# Patient Record
Sex: Female | Born: 1960 | Race: White | Hispanic: No | Smoking: Never smoker
Health system: Southern US, Community
[De-identification: ages and names within clinical notes are randomized; demographics above are authoritative.]

## PROBLEM LIST (undated history)

## (undated) DIAGNOSIS — N159 Renal tubulo-interstitial disease, unspecified: Secondary | ICD-10-CM

## (undated) DIAGNOSIS — I10 Essential (primary) hypertension: Secondary | ICD-10-CM

## (undated) DIAGNOSIS — M2550 Pain in unspecified joint: Secondary | ICD-10-CM

## (undated) DIAGNOSIS — R7303 Prediabetes: Secondary | ICD-10-CM

## (undated) DIAGNOSIS — K219 Gastro-esophageal reflux disease without esophagitis: Secondary | ICD-10-CM

## (undated) DIAGNOSIS — M1990 Unspecified osteoarthritis, unspecified site: Secondary | ICD-10-CM

## (undated) DIAGNOSIS — I872 Venous insufficiency (chronic) (peripheral): Secondary | ICD-10-CM

## (undated) DIAGNOSIS — M549 Dorsalgia, unspecified: Secondary | ICD-10-CM

## (undated) DIAGNOSIS — K5900 Constipation, unspecified: Secondary | ICD-10-CM

## (undated) DIAGNOSIS — E559 Vitamin D deficiency, unspecified: Secondary | ICD-10-CM

## (undated) DIAGNOSIS — G4730 Sleep apnea, unspecified: Secondary | ICD-10-CM

## (undated) HISTORY — DX: Constipation, unspecified: K59.00

## (undated) HISTORY — PX: EYE SURGERY: SHX253

## (undated) HISTORY — DX: Pain in unspecified joint: M25.50

## (undated) HISTORY — PX: ROTATOR CUFF REPAIR: SHX139

## (undated) HISTORY — PX: OTHER SURGICAL HISTORY: SHX169

## (undated) HISTORY — DX: Gastro-esophageal reflux disease without esophagitis: K21.9

## (undated) HISTORY — DX: Vitamin D deficiency, unspecified: E55.9

## (undated) HISTORY — DX: Unspecified osteoarthritis, unspecified site: M19.90

## (undated) HISTORY — DX: Prediabetes: R73.03

## (undated) HISTORY — DX: Dorsalgia, unspecified: M54.9

## (undated) HISTORY — DX: Venous insufficiency (chronic) (peripheral): I87.2

---

## 2006-08-22 ENCOUNTER — Encounter: Admission: RE | Admit: 2006-08-22 | Discharge: 2006-08-22 | Payer: Self-pay | Admitting: Orthopedic Surgery

## 2006-12-25 ENCOUNTER — Other Ambulatory Visit: Admission: RE | Admit: 2006-12-25 | Discharge: 2006-12-25 | Payer: Self-pay | Admitting: *Deleted

## 2008-01-06 ENCOUNTER — Other Ambulatory Visit: Admission: RE | Admit: 2008-01-06 | Discharge: 2008-01-06 | Payer: Self-pay | Admitting: *Deleted

## 2010-08-15 ENCOUNTER — Other Ambulatory Visit: Admission: RE | Admit: 2010-08-15 | Discharge: 2010-08-15 | Payer: Self-pay | Admitting: Obstetrics and Gynecology

## 2010-11-09 ENCOUNTER — Ambulatory Visit (HOSPITAL_COMMUNITY): Admission: RE | Admit: 2010-11-09 | Discharge: 2010-11-09 | Payer: Self-pay | Admitting: Gastroenterology

## 2011-01-21 ENCOUNTER — Encounter: Payer: Self-pay | Admitting: Orthopedic Surgery

## 2011-02-21 ENCOUNTER — Other Ambulatory Visit: Payer: Self-pay | Admitting: Obstetrics and Gynecology

## 2011-04-11 ENCOUNTER — Encounter (HOSPITAL_COMMUNITY)
Admission: RE | Admit: 2011-04-11 | Discharge: 2011-04-11 | Disposition: A | Discharge status: Home / Self Care | Payer: Federal, State, Local not specified - PPO | Source: Ambulatory Visit | Attending: Obstetrics and Gynecology | Admitting: Obstetrics and Gynecology

## 2011-04-11 LAB — CBC
HCT: 37.1 % (ref 36.0–46.0)
MCHC: 31.5 g/dL (ref 30.0–36.0)
MCV: 87.7 fL (ref 78.0–100.0)
RDW: 13.4 % (ref 11.5–15.5)

## 2011-04-12 ENCOUNTER — Other Ambulatory Visit: Payer: Self-pay | Admitting: Obstetrics and Gynecology

## 2011-04-12 ENCOUNTER — Ambulatory Visit (HOSPITAL_COMMUNITY)
Admission: RE | Admit: 2011-04-12 | Discharge: 2011-04-12 | Disposition: A | Discharge status: Home / Self Care | Payer: Federal, State, Local not specified - PPO | Source: Ambulatory Visit | Attending: Obstetrics and Gynecology | Admitting: Obstetrics and Gynecology

## 2011-04-12 DIAGNOSIS — N840 Polyp of corpus uteri: Secondary | ICD-10-CM | POA: Insufficient documentation

## 2011-04-12 DIAGNOSIS — Z01812 Encounter for preprocedural laboratory examination: Secondary | ICD-10-CM | POA: Insufficient documentation

## 2011-04-12 DIAGNOSIS — Z01818 Encounter for other preprocedural examination: Secondary | ICD-10-CM | POA: Insufficient documentation

## 2011-04-12 DIAGNOSIS — N920 Excessive and frequent menstruation with regular cycle: Secondary | ICD-10-CM | POA: Insufficient documentation

## 2011-04-18 NOTE — Op Note (Addendum)
  [name], [name] NO.:  [id number]  MEDICAL RECORD NO.:  [id number]           PATIENT TYPE:  O  LOCATION:  SDC                           FACILITY:  [facility]  PHYSICIAN:  Patsy Baltimore, MD     DATE OF BIRTH:  1961-02-12  DATE OF PROCEDURE: DATE OF DISCHARGE:  04/11/2011                              OPERATIVE REPORT   PREOPERATIVE DIAGNOSES: 1. Uterine polyp. 2. Menorrhagia.  POSTOPERATIVE DIAGNOSES: 1. Uterine polyp. 2. Menorrhagia.  PROCEDURE PERFORMED:  Hysteroscopy, polypectomy, dilation and curettage.  SURGEON:  Patsy Baltimore, MD.  ANESTHESIA:  General.  FINDINGS:  Endometrial polyps.  SPECIMEN:  Endometrial polyps.  DISPOSITION:  Specimen to Pathology.  ESTIMATED BLOOD LOSS:  Minimal.  PROCEDURE IN DETAIL:  [name] reiterated her informed consent on the day of the procedure.  She was consented for a hysteroscopy, polypectomy, dilation and curettage.  She has no known drug allergies. She was taken to the operating room with IV fluids running.  She was put under general anesthesia with ease.  Her legs were lifted up to the dorsal lithotomy position.  She was prepped and draped in the usual sterile fashion.  A time-out was called and we began the procedure.  Her bladder was drained with a red rubber catheter.  A speculum was inserted into her vagina.  Initial inspection showed a uterine polyp coming down towards the cervical os as well as another polyp in the uterus.  The scope was removed and the polyps were then grasped with the polyp forceps and removed.  Sharp curettage was done throughout the endometrial lining removing some more fragments of the polyps. Hysteroscopy was then repeated.  The endometrial cavity appeared clean. Both tubal ostia were visualized.  Some more scraping of the endometrium was done.  A good uterine cry was heard.  All the instruments were then removed from the vagina.  The patient was reawakened and  transferred to the PACU in stable condition.  She tolerated the procedure well.          ______________________________ Patsy Baltimore, MD     CO/MEDQ  D:  04/12/2011  T:  04/12/2011  Job:  [id number]  Electronically Signed by Patsy Baltimore MD on 04/18/2011 01:22:49 PM

## 2012-06-27 ENCOUNTER — Ambulatory Visit
Admission: RE | Admit: 2012-06-27 | Discharge: 2012-06-27 | Disposition: A | Discharge status: Home / Self Care | Payer: Federal, State, Local not specified - PPO | Source: Ambulatory Visit | Attending: Orthopedic Surgery | Admitting: Orthopedic Surgery

## 2012-06-27 ENCOUNTER — Other Ambulatory Visit: Payer: Self-pay | Admitting: Orthopedic Surgery

## 2012-06-27 DIAGNOSIS — M25572 Pain in left ankle and joints of left foot: Secondary | ICD-10-CM

## 2012-10-17 ENCOUNTER — Other Ambulatory Visit: Payer: Self-pay | Admitting: Internal Medicine

## 2012-10-17 DIAGNOSIS — Z1231 Encounter for screening mammogram for malignant neoplasm of breast: Secondary | ICD-10-CM

## 2012-11-25 ENCOUNTER — Ambulatory Visit
Admission: RE | Admit: 2012-11-25 | Discharge: 2012-11-25 | Disposition: A | Discharge status: Home / Self Care | Payer: Federal, State, Local not specified - PPO | Source: Ambulatory Visit | Attending: Internal Medicine | Admitting: Internal Medicine

## 2012-11-25 DIAGNOSIS — Z1231 Encounter for screening mammogram for malignant neoplasm of breast: Secondary | ICD-10-CM

## 2014-07-07 ENCOUNTER — Ambulatory Visit
Admission: RE | Admit: 2014-07-07 | Discharge: 2014-07-07 | Disposition: A | Discharge status: Home / Self Care | Payer: Federal, State, Local not specified - PPO | Source: Ambulatory Visit | Attending: Internal Medicine | Admitting: Internal Medicine

## 2014-07-07 ENCOUNTER — Other Ambulatory Visit: Payer: Self-pay | Admitting: Internal Medicine

## 2014-07-07 DIAGNOSIS — M549 Dorsalgia, unspecified: Secondary | ICD-10-CM

## 2014-09-29 ENCOUNTER — Other Ambulatory Visit: Payer: Self-pay | Admitting: Neurosurgery

## 2014-09-29 DIAGNOSIS — M5416 Radiculopathy, lumbar region: Secondary | ICD-10-CM

## 2014-10-13 ENCOUNTER — Ambulatory Visit
Admission: RE | Admit: 2014-10-13 | Discharge: 2014-10-13 | Disposition: A | Discharge status: Home / Self Care | Payer: Federal, State, Local not specified - PPO | Source: Ambulatory Visit | Attending: Neurosurgery | Admitting: Neurosurgery

## 2014-10-13 DIAGNOSIS — M5416 Radiculopathy, lumbar region: Secondary | ICD-10-CM

## 2014-10-14 ENCOUNTER — Other Ambulatory Visit: Payer: Federal, State, Local not specified - PPO

## 2015-04-18 ENCOUNTER — Other Ambulatory Visit: Payer: Self-pay | Admitting: Orthopedic Surgery

## 2015-04-18 DIAGNOSIS — M25511 Pain in right shoulder: Secondary | ICD-10-CM

## 2015-05-07 ENCOUNTER — Ambulatory Visit
Admission: RE | Admit: 2015-05-07 | Discharge: 2015-05-07 | Disposition: A | Discharge status: Home / Self Care | Payer: Federal, State, Local not specified - PPO | Source: Ambulatory Visit | Attending: Orthopedic Surgery | Admitting: Orthopedic Surgery

## 2015-05-07 DIAGNOSIS — M25511 Pain in right shoulder: Secondary | ICD-10-CM

## 2016-06-29 DIAGNOSIS — G4733 Obstructive sleep apnea (adult) (pediatric): Secondary | ICD-10-CM | POA: Diagnosis not present

## 2016-07-29 DIAGNOSIS — G4733 Obstructive sleep apnea (adult) (pediatric): Secondary | ICD-10-CM | POA: Diagnosis not present

## 2016-08-02 DIAGNOSIS — Z79899 Other long term (current) drug therapy: Secondary | ICD-10-CM | POA: Diagnosis not present

## 2016-08-02 DIAGNOSIS — Z0000 Encounter for general adult medical examination without abnormal findings: Secondary | ICD-10-CM | POA: Diagnosis not present

## 2016-08-02 DIAGNOSIS — K219 Gastro-esophageal reflux disease without esophagitis: Secondary | ICD-10-CM | POA: Diagnosis not present

## 2016-08-02 DIAGNOSIS — Z5181 Encounter for therapeutic drug level monitoring: Secondary | ICD-10-CM | POA: Diagnosis not present

## 2016-08-02 DIAGNOSIS — I10 Essential (primary) hypertension: Secondary | ICD-10-CM | POA: Diagnosis not present

## 2016-08-29 DIAGNOSIS — G4733 Obstructive sleep apnea (adult) (pediatric): Secondary | ICD-10-CM | POA: Diagnosis not present

## 2016-09-29 DIAGNOSIS — G4733 Obstructive sleep apnea (adult) (pediatric): Secondary | ICD-10-CM | POA: Diagnosis not present

## 2016-10-29 DIAGNOSIS — G4733 Obstructive sleep apnea (adult) (pediatric): Secondary | ICD-10-CM | POA: Diagnosis not present

## 2016-11-26 DIAGNOSIS — Z131 Encounter for screening for diabetes mellitus: Secondary | ICD-10-CM | POA: Diagnosis not present

## 2016-11-26 DIAGNOSIS — Z1329 Encounter for screening for other suspected endocrine disorder: Secondary | ICD-10-CM | POA: Diagnosis not present

## 2016-11-26 DIAGNOSIS — E559 Vitamin D deficiency, unspecified: Secondary | ICD-10-CM | POA: Diagnosis not present

## 2016-11-26 DIAGNOSIS — K219 Gastro-esophageal reflux disease without esophagitis: Secondary | ICD-10-CM | POA: Diagnosis not present

## 2016-11-26 DIAGNOSIS — R52 Pain, unspecified: Secondary | ICD-10-CM | POA: Diagnosis not present

## 2016-11-26 DIAGNOSIS — I10 Essential (primary) hypertension: Secondary | ICD-10-CM | POA: Diagnosis not present

## 2016-11-29 DIAGNOSIS — G4733 Obstructive sleep apnea (adult) (pediatric): Secondary | ICD-10-CM | POA: Diagnosis not present

## 2017-02-12 ENCOUNTER — Emergency Department (HOSPITAL_COMMUNITY)
Admission: EM | Admit: 2017-02-12 | Discharge: 2017-02-12 | Disposition: A | Discharge status: Home / Self Care | Payer: Federal, State, Local not specified - PPO | Attending: Emergency Medicine | Admitting: Emergency Medicine

## 2017-02-12 ENCOUNTER — Encounter (HOSPITAL_COMMUNITY): Payer: Self-pay

## 2017-02-12 DIAGNOSIS — I10 Essential (primary) hypertension: Secondary | ICD-10-CM

## 2017-02-12 DIAGNOSIS — R519 Headache, unspecified: Secondary | ICD-10-CM

## 2017-02-12 DIAGNOSIS — R51 Headache: Secondary | ICD-10-CM

## 2017-02-12 DIAGNOSIS — Z87891 Personal history of nicotine dependence: Secondary | ICD-10-CM | POA: Insufficient documentation

## 2017-02-12 DIAGNOSIS — E876 Hypokalemia: Secondary | ICD-10-CM | POA: Diagnosis not present

## 2017-02-12 HISTORY — DX: Essential (primary) hypertension: I10

## 2017-02-12 LAB — BASIC METABOLIC PANEL
ANION GAP: 10 (ref 5–15)
BUN: 14 mg/dL (ref 6–20)
CALCIUM: 9.5 mg/dL (ref 8.9–10.3)
CO2: 29 mmol/L (ref 22–32)
CREATININE: 0.76 mg/dL (ref 0.44–1.00)
Chloride: 100 mmol/L — ABNORMAL LOW (ref 101–111)
GFR calc Af Amer: >60 mL/min (ref >60)
GLUCOSE: 142 mg/dL — AB (ref 65–99)
Potassium: 3.1 mmol/L — ABNORMAL LOW (ref 3.5–5.1)
Sodium: 139 mmol/L (ref 135–145)

## 2017-02-12 MED ORDER — POTASSIUM CHLORIDE CRYS ER 20 MEQ PO TBCR: 20.0000 meq | EXTENDED_RELEASE_TABLET | Freq: Every day | ORAL | 0 refills | Status: DC

## 2017-02-12 MED ORDER — IBUPROFEN 200 MG PO TABS
200.0000 mg | ORAL_TABLET | Freq: Once | ORAL | Status: AC
  Administered 2017-02-12: 200 mg via ORAL
  Filled 2017-02-12: qty 1

## 2017-02-12 MED ORDER — OXYCODONE-ACETAMINOPHEN 5-325 MG PO TABS
1.0000 | ORAL_TABLET | Freq: Once | ORAL | Status: AC
  Administered 2017-02-12: 1 via ORAL
  Filled 2017-02-12: qty 1

## 2017-02-12 MED ORDER — ASPIRIN-ACETAMINOPHEN-CAFFEINE 250-250-65 MG PO TABS
1.0000 | ORAL_TABLET | Freq: Once | ORAL | Status: AC
  Administered 2017-02-12: 1 via ORAL
  Filled 2017-02-12 (×2): qty 1

## 2017-02-12 MED ORDER — POTASSIUM CHLORIDE CRYS ER 20 MEQ PO TBCR
40.0000 meq | EXTENDED_RELEASE_TABLET | Freq: Once | ORAL | Status: AC
  Administered 2017-02-12: 40 meq via ORAL
  Filled 2017-02-12: qty 2

## 2017-02-12 NOTE — ED Triage Notes (Signed)
Pt here with htn and headache.  Pt has had changes in her bp meds recently.  Keeping throbbing headaches.

## 2017-02-12 NOTE — Discharge Instructions (Signed)
It was our pleasure to provide your ER care today - we hope that you feel better.  Rest. Drink adequate fluids.  Continue your blood pressure medication.  Limit salt intake.  See hypertension instructions.   Your blood pressure is high today - follow up with primary care doctor in the next few days.  From todays labs, your potassium level is mildly low (3.1) - eat plenty of fruits and vegetables, take potassium supplement as prescribed, and follow up with primary care doctor in 1 week.   Return to ER if worse, severe headache, chest pain, trouble breathing, other concern.  You were given pain medication in the ER - no driving for the next 4 hours.

## 2017-02-12 NOTE — ED Provider Notes (Signed)
[facility]-EMERGENCY DEPT Provider Note   CSN: [id number] Arrival date & time: 02/12/17  1313     History   Chief Complaint Chief Complaint  Patient presents with  . Headache    HPI Stacy Wilkerson is a 56 y.o. female.  Patient c/o intermittent frontal headache for the past 3 days. Pain is throbbing dull, moderate. Headache was gradual in onset, and mild at onset. No acute or abrupt worsening, or severe head pain. No neck pain or stiffness. Denies eye pain or change in vision. No associated numbness/weakness, or change in normal functional ability. No change in speech, balance or coordination. Denies specific exacerbating or alleviating factors.  No hx migraines or chronic headaches. Pt indicates she feels it is related to her blood pressure which has been high. Indicates 2 months ago was changed from amlodipine to lisinopril, and indicates yesterday had hctz added as bp still high. No chest pain or sob. No swelling.  Pt denies uri symptoms, no sinus drainage or pain.    The history is provided by the patient.  Headache   Pertinent negatives include no fever, no shortness of breath, no nausea and no vomiting.    Past Medical History:  Diagnosis Date  . Hypertension     There are no active problems to display for this patient.   Past Surgical History:  Procedure Laterality Date  . ROTATOR CUFF REPAIR      OB History    No data available       Home Medications    Prior to Admission medications   Not on File    Family History History reviewed. No pertinent family history.  Social History Social History  Substance Use Topics  . Smoking status: Former Games developermoker  . Smokeless tobacco: Never Used  . Alcohol use Yes     Comment: social     Allergies   Sulfa antibiotics   Review of Systems Review of Systems  Constitutional: Negative for fever.  HENT: Negative for sore throat.   Eyes: Negative for pain and visual disturbance.  Respiratory: Negative for shortness  of breath.   Cardiovascular: Negative for chest pain and leg swelling.  Gastrointestinal: Negative for abdominal pain, nausea and vomiting.  Genitourinary: Negative for flank pain.  Musculoskeletal: Negative for back pain and neck pain.  Skin: Negative for rash.  Neurological: Positive for headaches. Negative for weakness and numbness.  Hematological: Does not bruise/bleed easily.  Psychiatric/Behavioral: Negative for confusion.     Physical Exam Updated Vital Signs BP (!) 192/101   Pulse 77   Temp 97.8 F (36.6 C) (Oral)   Resp 16   SpO2 98%   Physical Exam  Constitutional: She is oriented to person, place, and time. She appears well-developed and well-nourished. No distress.  HENT:  Head: Atraumatic.  Nose: Nose normal.  Mouth/Throat: Oropharynx is clear and moist.  No sinus or temporal tenderness.  Eyes: Conjunctivae and EOM are normal. Pupils are equal, round, and reactive to light. No scleral icterus.  Neck: Neck supple. No tracheal deviation present. No thyromegaly present.  No stiffness or rigidity.   Cardiovascular: Normal rate, regular rhythm, normal heart sounds and intact distal pulses.  Exam reveals no gallop and no friction rub.   No murmur heard. Pulmonary/Chest: Effort normal and breath sounds normal. No respiratory distress.  Abdominal: Soft. Normal appearance and bowel sounds are normal. She exhibits no distension. There is no tenderness.  No bruits.   Genitourinary:  Genitourinary Comments: No cva tenderness.  Musculoskeletal:  Normal range of motion. She exhibits no edema or tenderness.  Neurological: She is alert and oriented to person, place, and time. No cranial nerve deficit.  Speech clear/fluent. No pronator drift. Motor intact bilaterally, stre 5/5. sens grossly intact.  Steady gait.   Skin: Skin is warm and dry. No rash noted. She is not diaphoretic.  Psychiatric: She has a normal mood and affect.  Nursing note and vitals reviewed.    ED  Treatments / Results  Labs (all labs ordered are listed, but only abnormal results are displayed) Results for orders placed or performed during the hospital encounter of 02/12/17  Basic metabolic panel  Result Value Ref Range   Sodium 139 135 - 145 mmol/L   Potassium 3.1 (L) 3.5 - 5.1 mmol/L   Chloride 100 (L) 101 - 111 mmol/L   CO2 29 22 - 32 mmol/L   Glucose, Bld 142 (H) 65 - 99 mg/dL   BUN 14 6 - 20 mg/dL   Creatinine, Ser 1.61 0.44 - 1.00 mg/dL   Calcium 9.5 8.9 - 09.6 mg/dL   GFR calc non Af Amer >60 >60 mL/min   GFR calc Af Amer >60 >60 mL/min   Anion gap 10 5 - 15    EKG  EKG Interpretation None       Radiology No results found.  Procedures Procedures (including critical care time)  Medications Ordered in ED Medications  oxyCODONE-acetaminophen (PERCOCET/ROXICET) 5-325 MG per tablet 1 tablet (not administered)  ibuprofen (ADVIL,MOTRIN) tablet 200 mg (not administered)     Initial Impression / Assessment and Plan / ED Course  I have reviewed the triage vital signs and the nursing notes.  Pertinent labs & imaging results that were available during my care of the patient were reviewed by me and considered in my medical decision making (see chart for details).  Reviewed nursing notes and prior charts for additional history.   No meds prior to arrival. Pt indicates she took her normal bp med early this AM.    Pt has ride. Percocet 1 po. Motrin 200 mg po.  Will recheck bp.   Recheck, bp improved, headache improved.  Patient currently appears stable for d/c.  kcl po.     Final Clinical Impressions(s) / ED Diagnoses   Final diagnoses:  None    New Prescriptions New Prescriptions   No medications on file     Cathren Laine, MD 02/12/17 1552

## 2017-02-12 NOTE — ED Notes (Signed)
ED Provider at bedside. 

## 2017-02-12 NOTE — ED Notes (Signed)
Discharge instructions, follow up care, and rx x1 reviewed with patient. Patient verbalized understanding. 

## 2017-02-12 NOTE — ED Notes (Signed)
Patient given water

## 2017-02-13 DIAGNOSIS — D1801 Hemangioma of skin and subcutaneous tissue: Secondary | ICD-10-CM | POA: Diagnosis not present

## 2017-02-13 DIAGNOSIS — R222 Localized swelling, mass and lump, trunk: Secondary | ICD-10-CM | POA: Diagnosis not present

## 2017-02-13 DIAGNOSIS — D225 Melanocytic nevi of trunk: Secondary | ICD-10-CM | POA: Diagnosis not present

## 2017-03-14 DIAGNOSIS — I10 Essential (primary) hypertension: Secondary | ICD-10-CM | POA: Diagnosis not present

## 2017-03-14 DIAGNOSIS — M2550 Pain in unspecified joint: Secondary | ICD-10-CM | POA: Diagnosis not present

## 2017-03-15 ENCOUNTER — Encounter (HOSPITAL_COMMUNITY): Payer: Self-pay

## 2017-03-15 DIAGNOSIS — E876 Hypokalemia: Secondary | ICD-10-CM | POA: Diagnosis not present

## 2017-03-15 DIAGNOSIS — I10 Essential (primary) hypertension: Secondary | ICD-10-CM | POA: Insufficient documentation

## 2017-03-15 DIAGNOSIS — R799 Abnormal finding of blood chemistry, unspecified: Secondary | ICD-10-CM | POA: Diagnosis present

## 2017-03-15 DIAGNOSIS — Z87891 Personal history of nicotine dependence: Secondary | ICD-10-CM | POA: Diagnosis not present

## 2017-03-15 DIAGNOSIS — Z79899 Other long term (current) drug therapy: Secondary | ICD-10-CM | POA: Insufficient documentation

## 2017-03-15 NOTE — ED Triage Notes (Signed)
Pt states that her doctor called her today and her potassium level is 2.6. She denies any chest pain, palpitations, or muscle spasms at this time. Labs were drawn yesterday. A&Ox4. Ambulatory.

## 2017-03-15 NOTE — ED Notes (Addendum)
Entered in error

## 2017-03-16 ENCOUNTER — Emergency Department (HOSPITAL_COMMUNITY)
Admission: EM | Admit: 2017-03-16 | Discharge: 2017-03-16 | Disposition: A | Discharge status: Home / Self Care | Payer: Federal, State, Local not specified - PPO | Attending: Emergency Medicine | Admitting: Emergency Medicine

## 2017-03-16 DIAGNOSIS — E876 Hypokalemia: Secondary | ICD-10-CM

## 2017-03-16 LAB — COMPREHENSIVE METABOLIC PANEL
ALT: 44 U/L (ref 14–54)
AST: 36 U/L (ref 15–41)
Albumin: 4.2 g/dL (ref 3.5–5.0)
Alkaline Phosphatase: 68 U/L (ref 38–126)
Anion gap: 9 (ref 5–15)
BUN: 15 mg/dL (ref 6–20)
CHLORIDE: 101 mmol/L (ref 101–111)
CO2: 31 mmol/L (ref 22–32)
CREATININE: 0.75 mg/dL (ref 0.44–1.00)
Calcium: 9 mg/dL (ref 8.9–10.3)
GFR calc non Af Amer: >60 mL/min (ref >60)
Glucose, Bld: 109 mg/dL — ABNORMAL HIGH (ref 65–99)
POTASSIUM: 2.7 mmol/L — AB (ref 3.5–5.1)
SODIUM: 141 mmol/L (ref 135–145)
Total Bilirubin: 0.8 mg/dL (ref 0.3–1.2)
Total Protein: 7.7 g/dL (ref 6.5–8.1)

## 2017-03-16 LAB — CBC
HEMATOCRIT: 36 % (ref 36.0–46.0)
HEMOGLOBIN: 12.1 g/dL (ref 12.0–15.0)
MCH: 27.9 pg (ref 26.0–34.0)
MCHC: 33.6 g/dL (ref 30.0–36.0)
MCV: 82.9 fL (ref 78.0–100.0)
PLATELETS: 204 10*3/uL (ref 150–400)
RBC: 4.34 MIL/uL (ref 3.87–5.11)
RDW: 14.4 % (ref 11.5–15.5)
WBC: 8.2 10*3/uL (ref 4.0–10.5)

## 2017-03-16 MED ORDER — POTASSIUM CHLORIDE CRYS ER 20 MEQ PO TBCR
60.0000 meq | EXTENDED_RELEASE_TABLET | Freq: Once | ORAL | Status: AC
  Administered 2017-03-16: 60 meq via ORAL
  Filled 2017-03-16: qty 3

## 2017-03-16 MED ORDER — SODIUM CHLORIDE 0.9 % IV SOLN
30.0000 meq | Freq: Once | INTRAVENOUS | Status: AC
  Administered 2017-03-16: 30 meq via INTRAVENOUS
  Filled 2017-03-16: qty 15

## 2017-03-16 NOTE — ED Notes (Signed)
Patient walked to restroom under own power

## 2017-03-16 NOTE — ED Notes (Signed)
Bed: WA24 Expected date:  Expected time:  Means of arrival:  Comments: TR 

## 2017-03-16 NOTE — Discharge Instructions (Signed)
Your potassium was very low, but you were given potassium in the ER so you should have improved levels. Stay well hydrated. Use the list of foods below to help supplement your potassium intake. Follow up with your primary care doctor in 3-5 days for recheck of blood work and symptoms, and for ongoing management of your blood pressure medications as well as your low potassium levels. Return to the ER for emergent changes or worsening symptoms.

## 2017-03-16 NOTE — ED Provider Notes (Signed)
[facility]-EMERGENCY DEPT Provider Note   CSN: [id number] Arrival date & time: 03/15/17  2018   By signing my name below, I, Stacy Wilkerson, attest that this documentation has been prepared under the direction and in the presence of [address], [employer].Marland Kitchen Electronically Signed: Clarisse Wilkerson, Scribe. 03/16/17. 1:21 AM.   History   Chief Complaint Chief Complaint  Patient presents with  . Abnormal Lab   The history is provided by the patient and medical records. No language interpreter was used.  Abnormal Lab  Time since result:  2 days Patient referred by:  PCP Result type: chemistry   Chemistry:    Potassium:  Low   HPI Comments: Stacy Wilkerson is a 56 y.o. female with a PMHx of HTN who presents to the Emergency Department for evaluation pertaining to her potassium levels. She states she had labs drawn at her PCP's office on 03/14/2017 and tonight she was called by her PCP to advise her that her potassium levels were 2.6. Pt was told to report to [facility] ED by her PCP for evaluation of this finding. No known aggravating or alleviating factors given that she has not tried anything for her issue, since she didn't know it was low. She states she has recently undergone a lot of changes to her blood pressure medicines, which is why she had blood work done. Pt notes she was switched from amlodipine to lisinopril and HCTZ. Pt denies any complaints, including fevers, chills, chest pain, palpitations, SOB, abdominal pain, N/V/D/C, urinary symptoms, muscle spasms, myalgias, arthralgias, numbness, tingling, focal weakness, or any other complaints at this time.   Past Medical History:  Diagnosis Date  . Hypertension     There are no active problems to display for this patient.   Past Surgical History:  Procedure Laterality Date  . ROTATOR CUFF REPAIR      OB History    No data available       Home Medications    Prior to Admission medications   Medication Sig Start Date End Date Taking?  Authorizing Provider  potassium chloride SA (K-DUR,KLOR-CON) 20 MEQ tablet Take 1 tablet (20 mEq total) by mouth daily. 02/12/17   Cathren Laine, MD    Family History History reviewed. No pertinent family history.  Social History Social History  Substance Use Topics  . Smoking status: Former Games developer  . Smokeless tobacco: Never Used  . Alcohol use Yes     Comment: social     Allergies   Sulfa antibiotics   Review of Systems Review of Systems  Constitutional: Negative for chills and fever.  Respiratory: Negative for shortness of breath.   Cardiovascular: Negative for chest pain and palpitations.  Gastrointestinal: Negative for abdominal pain, constipation, diarrhea, nausea and vomiting.  Genitourinary: Negative for dysuria and hematuria.  Musculoskeletal: Negative for arthralgias and myalgias.  Skin: Negative for color change.  Allergic/Immunologic: Negative for immunocompromised state.  Neurological: Negative for weakness and numbness.  Psychiatric/Behavioral: Negative for confusion.   10 Systems reviewed and all are negative for acute change except as noted in the HPI.    Physical Exam Updated Vital Signs BP (!) 166/105 (BP Location: Left Arm)   Pulse 70   Temp 97.9 F (36.6 C) (Oral)   Resp 18   Ht 5\' 7"  (1.702 m)   Wt 247 lb (112 kg)   SpO2 99%   BMI 38.69 kg/m   Physical Exam  Constitutional: She is oriented to person, place, and time. Vital signs are normal. She  appears well-developed and well-nourished.  Non-toxic appearance. No distress.  Afebrile, nontoxic, NAD  HENT:  Head: Normocephalic and atraumatic.  Mouth/Throat: Oropharynx is clear and moist and mucous membranes are normal.  Eyes: Conjunctivae and EOM are normal. Right eye exhibits no discharge. Left eye exhibits no discharge.  Neck: Normal range of motion. Neck supple.  Cardiovascular: Normal rate, regular rhythm, normal heart sounds and intact distal pulses.  Exam reveals no gallop and no  friction rub.   No murmur heard. RRR, nl s1/s2, no m/r/g, distal pulses intact, no pedal edema   Pulmonary/Chest: Effort normal and breath sounds normal. No respiratory distress. She has no decreased breath sounds. She has no wheezes. She has no rhonchi. She has no rales.  Abdominal: Soft. Normal appearance and bowel sounds are normal. She exhibits no distension. There is no tenderness. There is no rigidity, no rebound, no guarding, no CVA tenderness, no tenderness at McBurney's point and negative Murphy's sign.  Musculoskeletal: Normal range of motion.  Neurological: She is alert and oriented to person, place, and time. She has normal strength. No sensory deficit.  Skin: Skin is warm, dry and intact. No rash noted.  Psychiatric: She has a normal mood and affect.  Nursing note and vitals reviewed.    ED Treatments / Results  DIAGNOSTIC STUDIES: Oxygen Saturation is 99% on RA, normal by my interpretation.    COORDINATION OF CARE: 1:11 AM Discussed treatment plan with pt at bedside and pt agreed to plan. Will order fluids and medications.  Labs (all labs ordered are listed, but only abnormal results are displayed) Labs Reviewed  COMPREHENSIVE METABOLIC PANEL - Abnormal; Notable for the following:       Result Value   Potassium 2.7 (*)    Glucose, Bld 109 (*)    All other components within normal limits  CBC    EKG  EKG Interpretation  Date/Time:  Saturday March 16 2017 01:18:50 EDT Ventricular Rate:  71 PR Interval:    QRS Duration: 94 QT Interval:  438 QTC Calculation: 476 R Axis:   -26 Text Interpretation:  Sinus rhythm Borderline left axis deviation No old tracing to compare Confirmed by ISAACS MD, CAMERON [phone]([id number]) on 03/16/2017 2:14:16 AM       Radiology No results found.  Procedures Procedures (including critical care time)  Medications Ordered in ED Medications  potassium chloride SA (K-DUR,KLOR-CON) CR tablet 60 mEq (60 mEq Oral Given 03/16/17 0111)  potassium  chloride 30 mEq in sodium chloride 0.9 % 265 mL (KCL MULTIRUN) IVPB (30 mEq Intravenous Given During Downtime 03/16/17 0130)     Initial Impression / Assessment and Plan / ED Course  I have reviewed the triage vital signs and the nursing notes.  Pertinent labs & imaging results that were available during my care of the patient were reviewed by me and considered in my medical decision making (see chart for details).     56 y.o. female here with abnormal potassium level on outside labs; PCP called stating K 2.6, told to come here. Recent changes in BP meds, likely contributory to this abnormality. Pt asymptomatic. Repeat CMP here shows K 2.7, will need to replete orally and IV; will obtain EKG. CBC WNL. Will give 60mEq orally and 30mEq IV then reassess shortly.   6:28 AM EKG unremarkable. Pt's potassium infusion finished, continues to deny any ongoing issues; BP improved at this time. Will have her f/up with her PCP in 3-5 days for ongoing management of her medications and hypokalemia.  List of foods given for potassium repletion at home. Advised calling her PCP regarding any changes in BP meds, but for now will leave them the way they are. I explained the diagnosis and have given explicit precautions to return to the ER including for any other new or worsening symptoms. The patient understands and accepts the medical plan as it's been dictated and I have answered their questions. Discharge instructions concerning home care and prescriptions have been given. The patient is STABLE and is discharged to home in good condition.   I personally performed the services described in this documentation, which was scribed in my presence. The recorded information has been reviewed and is accurate.   Final Clinical Impressions(s) / ED Diagnoses   Final diagnoses:  Hypokalemia    New Prescriptions New Prescriptions   No medications on file     [address] PA-C 03/16/17 [id number]    Derwood Kaplan,  MD 03/17/17 0025

## 2017-03-21 DIAGNOSIS — E876 Hypokalemia: Secondary | ICD-10-CM | POA: Diagnosis not present

## 2017-03-21 DIAGNOSIS — I10 Essential (primary) hypertension: Secondary | ICD-10-CM | POA: Diagnosis not present

## 2017-03-26 DIAGNOSIS — H903 Sensorineural hearing loss, bilateral: Secondary | ICD-10-CM | POA: Diagnosis not present

## 2017-03-27 DIAGNOSIS — E876 Hypokalemia: Secondary | ICD-10-CM | POA: Diagnosis not present

## 2017-03-28 DIAGNOSIS — D171 Benign lipomatous neoplasm of skin and subcutaneous tissue of trunk: Secondary | ICD-10-CM | POA: Diagnosis not present

## 2017-04-08 ENCOUNTER — Other Ambulatory Visit: Payer: Self-pay | Admitting: Surgery

## 2017-04-08 DIAGNOSIS — D171 Benign lipomatous neoplasm of skin and subcutaneous tissue of trunk: Secondary | ICD-10-CM | POA: Diagnosis not present

## 2017-04-10 DIAGNOSIS — I10 Essential (primary) hypertension: Secondary | ICD-10-CM | POA: Diagnosis not present

## 2017-05-02 DIAGNOSIS — H903 Sensorineural hearing loss, bilateral: Secondary | ICD-10-CM | POA: Diagnosis not present

## 2017-05-13 DIAGNOSIS — H33311 Horseshoe tear of retina without detachment, right eye: Secondary | ICD-10-CM | POA: Diagnosis not present

## 2017-07-10 DIAGNOSIS — L309 Dermatitis, unspecified: Secondary | ICD-10-CM | POA: Diagnosis not present

## 2017-07-10 DIAGNOSIS — Z0000 Encounter for general adult medical examination without abnormal findings: Secondary | ICD-10-CM | POA: Diagnosis not present

## 2017-07-10 DIAGNOSIS — I10 Essential (primary) hypertension: Secondary | ICD-10-CM | POA: Diagnosis not present

## 2017-07-11 DIAGNOSIS — L03116 Cellulitis of left lower limb: Secondary | ICD-10-CM | POA: Diagnosis not present

## 2017-07-11 DIAGNOSIS — S90562A Insect bite (nonvenomous), left ankle, initial encounter: Secondary | ICD-10-CM | POA: Diagnosis not present

## 2017-10-16 ENCOUNTER — Other Ambulatory Visit: Payer: Self-pay | Admitting: Family Medicine

## 2017-10-16 DIAGNOSIS — I10 Essential (primary) hypertension: Secondary | ICD-10-CM | POA: Diagnosis not present

## 2017-10-16 DIAGNOSIS — M549 Dorsalgia, unspecified: Secondary | ICD-10-CM | POA: Diagnosis not present

## 2017-10-16 DIAGNOSIS — E876 Hypokalemia: Secondary | ICD-10-CM | POA: Diagnosis not present

## 2017-10-24 DIAGNOSIS — E876 Hypokalemia: Secondary | ICD-10-CM | POA: Diagnosis not present

## 2017-10-29 DIAGNOSIS — G4733 Obstructive sleep apnea (adult) (pediatric): Secondary | ICD-10-CM | POA: Diagnosis not present

## 2017-10-30 ENCOUNTER — Ambulatory Visit
Admission: RE | Admit: 2017-10-30 | Discharge: 2017-10-30 | Disposition: A | Discharge status: Home / Self Care | Payer: Federal, State, Local not specified - PPO | Source: Ambulatory Visit | Attending: Family Medicine | Admitting: Family Medicine

## 2017-10-30 DIAGNOSIS — M48061 Spinal stenosis, lumbar region without neurogenic claudication: Secondary | ICD-10-CM | POA: Diagnosis not present

## 2017-10-30 DIAGNOSIS — M549 Dorsalgia, unspecified: Secondary | ICD-10-CM

## 2017-11-26 DIAGNOSIS — G8929 Other chronic pain: Secondary | ICD-10-CM | POA: Diagnosis not present

## 2017-11-26 DIAGNOSIS — M545 Low back pain: Secondary | ICD-10-CM | POA: Diagnosis not present

## 2017-12-31 DIAGNOSIS — N159 Renal tubulo-interstitial disease, unspecified: Secondary | ICD-10-CM

## 2017-12-31 HISTORY — DX: Renal tubulo-interstitial disease, unspecified: N15.9

## 2018-01-16 DIAGNOSIS — M549 Dorsalgia, unspecified: Secondary | ICD-10-CM | POA: Diagnosis not present

## 2018-01-16 DIAGNOSIS — Z6841 Body Mass Index (BMI) 40.0 and over, adult: Secondary | ICD-10-CM | POA: Diagnosis not present

## 2018-01-16 DIAGNOSIS — I10 Essential (primary) hypertension: Secondary | ICD-10-CM | POA: Diagnosis not present

## 2018-05-28 DIAGNOSIS — H33322 Round hole, left eye: Secondary | ICD-10-CM | POA: Diagnosis not present

## 2018-05-28 DIAGNOSIS — Z01818 Encounter for other preprocedural examination: Secondary | ICD-10-CM | POA: Diagnosis not present

## 2018-05-28 DIAGNOSIS — H4311 Vitreous hemorrhage, right eye: Secondary | ICD-10-CM | POA: Diagnosis not present

## 2018-05-29 DIAGNOSIS — H4311 Vitreous hemorrhage, right eye: Secondary | ICD-10-CM | POA: Diagnosis not present

## 2018-05-29 DIAGNOSIS — H35411 Lattice degeneration of retina, right eye: Secondary | ICD-10-CM | POA: Diagnosis not present

## 2018-05-29 DIAGNOSIS — H5789 Other specified disorders of eye and adnexa: Secondary | ICD-10-CM | POA: Diagnosis not present

## 2018-06-03 DIAGNOSIS — H33311 Horseshoe tear of retina without detachment, right eye: Secondary | ICD-10-CM | POA: Diagnosis not present

## 2018-06-05 DIAGNOSIS — H33311 Horseshoe tear of retina without detachment, right eye: Secondary | ICD-10-CM | POA: Diagnosis not present

## 2018-06-12 DIAGNOSIS — H33311 Horseshoe tear of retina without detachment, right eye: Secondary | ICD-10-CM | POA: Diagnosis not present

## 2018-07-10 DIAGNOSIS — H35371 Puckering of macula, right eye: Secondary | ICD-10-CM | POA: Diagnosis not present

## 2018-07-16 DIAGNOSIS — H4311 Vitreous hemorrhage, right eye: Secondary | ICD-10-CM | POA: Diagnosis not present

## 2018-07-16 DIAGNOSIS — H35371 Puckering of macula, right eye: Secondary | ICD-10-CM | POA: Diagnosis not present

## 2018-07-22 DIAGNOSIS — Z136 Encounter for screening for cardiovascular disorders: Secondary | ICD-10-CM | POA: Diagnosis not present

## 2018-07-22 DIAGNOSIS — I10 Essential (primary) hypertension: Secondary | ICD-10-CM | POA: Diagnosis not present

## 2018-07-22 DIAGNOSIS — Z0000 Encounter for general adult medical examination without abnormal findings: Secondary | ICD-10-CM | POA: Diagnosis not present

## 2018-07-28 DIAGNOSIS — H35371 Puckering of macula, right eye: Secondary | ICD-10-CM | POA: Diagnosis not present

## 2018-08-06 DIAGNOSIS — H35371 Puckering of macula, right eye: Secondary | ICD-10-CM | POA: Diagnosis not present

## 2018-08-13 DIAGNOSIS — R350 Frequency of micturition: Secondary | ICD-10-CM | POA: Diagnosis not present

## 2018-08-13 DIAGNOSIS — R82998 Other abnormal findings in urine: Secondary | ICD-10-CM | POA: Diagnosis not present

## 2018-08-15 ENCOUNTER — Emergency Department (HOSPITAL_COMMUNITY)
Admission: EM | Admit: 2018-08-15 | Discharge: 2018-08-15 | Disposition: A | Discharge status: Home / Self Care | Payer: Federal, State, Local not specified - PPO | Attending: Emergency Medicine | Admitting: Emergency Medicine

## 2018-08-15 ENCOUNTER — Emergency Department (HOSPITAL_COMMUNITY): Payer: Federal, State, Local not specified - PPO

## 2018-08-15 ENCOUNTER — Encounter (HOSPITAL_COMMUNITY): Payer: Self-pay

## 2018-08-15 DIAGNOSIS — R1030 Lower abdominal pain, unspecified: Secondary | ICD-10-CM | POA: Diagnosis not present

## 2018-08-15 DIAGNOSIS — N12 Tubulo-interstitial nephritis, not specified as acute or chronic: Secondary | ICD-10-CM

## 2018-08-15 DIAGNOSIS — N10 Acute tubulo-interstitial nephritis: Secondary | ICD-10-CM | POA: Diagnosis not present

## 2018-08-15 DIAGNOSIS — R109 Unspecified abdominal pain: Secondary | ICD-10-CM | POA: Diagnosis not present

## 2018-08-15 DIAGNOSIS — R112 Nausea with vomiting, unspecified: Secondary | ICD-10-CM | POA: Diagnosis not present

## 2018-08-15 DIAGNOSIS — K5900 Constipation, unspecified: Secondary | ICD-10-CM | POA: Insufficient documentation

## 2018-08-15 LAB — URINALYSIS, ROUTINE W REFLEX MICROSCOPIC
Bilirubin Urine: NEGATIVE
GLUCOSE, UA: NEGATIVE mg/dL
Ketones, ur: NEGATIVE mg/dL
Leukocytes, UA: NEGATIVE
Nitrite: POSITIVE — AB
PH: 6 (ref 5.0–8.0)
Protein, ur: 30 mg/dL — AB
SPECIFIC GRAVITY, URINE: 1.012 (ref 1.005–1.030)

## 2018-08-15 LAB — CBC
HEMATOCRIT: 41.4 % (ref 36.0–46.0)
Hemoglobin: 14 g/dL (ref 12.0–15.0)
MCH: 29.9 pg (ref 26.0–34.0)
MCHC: 33.8 g/dL (ref 30.0–36.0)
MCV: 88.3 fL (ref 78.0–100.0)
Platelets: 230 10*3/uL (ref 150–400)
RBC: 4.69 MIL/uL (ref 3.87–5.11)
RDW: 13.8 % (ref 11.5–15.5)
WBC: 11.8 10*3/uL — AB (ref 4.0–10.5)

## 2018-08-15 LAB — COMPREHENSIVE METABOLIC PANEL
ALT: 41 U/L (ref 0–44)
AST: 32 U/L (ref 15–41)
Albumin: 4 g/dL (ref 3.5–5.0)
Alkaline Phosphatase: 66 U/L (ref 38–126)
Anion gap: 13 (ref 5–15)
BILIRUBIN TOTAL: 0.8 mg/dL (ref 0.3–1.2)
BUN: 33 mg/dL — ABNORMAL HIGH (ref 6–20)
CO2: 25 mmol/L (ref 22–32)
CREATININE: 1.01 mg/dL — AB (ref 0.44–1.00)
Calcium: 9.7 mg/dL (ref 8.9–10.3)
Chloride: 103 mmol/L (ref 98–111)
Glucose, Bld: 134 mg/dL — ABNORMAL HIGH (ref 70–99)
Potassium: 3.7 mmol/L (ref 3.5–5.1)
Sodium: 141 mmol/L (ref 135–145)
TOTAL PROTEIN: 7.7 g/dL (ref 6.5–8.1)

## 2018-08-15 MED ORDER — ONDANSETRON 8 MG PO TBDP
8.0000 mg | ORAL_TABLET | Freq: Once | ORAL | Status: AC
  Administered 2018-08-15: 8 mg via ORAL
  Filled 2018-08-15: qty 1

## 2018-08-15 MED ORDER — CEPHALEXIN 500 MG PO CAPS: 500.0000 mg | ORAL_CAPSULE | Freq: Two times a day (BID) | ORAL | 0 refills | Status: DC

## 2018-08-15 MED ORDER — ONDANSETRON 8 MG PO TBDP: 8.0000 mg | ORAL_TABLET | Freq: Three times a day (TID) | ORAL | 0 refills | Status: DC | PRN

## 2018-08-15 MED ORDER — SODIUM CHLORIDE 0.9 % IV BOLUS
1000.0000 mL | Freq: Once | INTRAVENOUS | Status: AC
  Administered 2018-08-15: 1000 mL via INTRAVENOUS

## 2018-08-15 MED ORDER — POLYETHYLENE GLYCOL 3350 17 G PO PACK: 17.0000 g | PACK | Freq: Every day | ORAL | 0 refills | Status: DC

## 2018-08-15 MED ORDER — CEFTRIAXONE SODIUM 1 G IJ SOLR
1.0000 g | Freq: Once | INTRAMUSCULAR | Status: AC
  Administered 2018-08-15: 1 g via INTRAVENOUS
  Filled 2018-08-15: qty 10

## 2018-08-15 MED ORDER — ACETAMINOPHEN ER 650 MG PO TBCR: 650.0000 mg | EXTENDED_RELEASE_TABLET | Freq: Three times a day (TID) | ORAL | 0 refills | Status: DC | PRN

## 2018-08-15 NOTE — ED Notes (Signed)
MD at bedside. 

## 2018-08-15 NOTE — ED Provider Notes (Signed)
[facility]-EMERGENCY DEPT Provider Note   CSN: [id number] Arrival date & time: 08/15/18  [id number]     History   Chief Complaint Chief Complaint  Patient presents with  . Constipation    HPI Christie BeckersJanet I Basurto is a 57 y.o. female.  HPI 57 year old with history of hypertension comes in with chief complaint of abdominal discomfort.  Patient does not have any significant medical history and no abdominal surgical history.  Patient reports that about 3 days ago she started having abdominal discomfort.  She had some loose bowel movements on the first day, thereafter she has been more constipated.  Last night patient started having worsening of her abdominal pain.  Abdominal pain is generalized in the lower quadrants, and patient has associated indigestion.  Patient also has had some nausea without vomiting.  Patient states that she had some discomfort with urination, and so remains hospital where she was told that she has UTI but was not started on antibiotics.  Patient was given Pyridium.  Patient is now also having pain in her left flank region.  There is no associated fevers or chills.  Patient denies any vaginal bleeding or discharge.  Past Medical History:  Diagnosis Date  . Hypertension     There are no active problems to display for this patient.   Past Surgical History:  Procedure Laterality Date  . ROTATOR CUFF REPAIR       OB History   None      Home Medications    Prior to Admission medications   Medication Sig Start Date End Date Taking? Authorizing Provider  acetaminophen (TYLENOL 8 HOUR) 650 MG CR tablet Take 1 tablet (650 mg total) by mouth every 8 (eight) hours as needed. 08/15/18   Derwood KaplanNanavati, Sanjna Haskew, MD  cephALEXin (KEFLEX) 500 MG capsule Take 1 capsule (500 mg total) by mouth 2 (two) times daily. 08/15/18   Derwood KaplanNanavati, Issac Moure, MD  Cholecalciferol (VITAMIN D) 2000 units CAPS Take 2,000 Units by mouth daily.    [provider]  FIBER PO Take  2 tablets by mouth daily.    [provider]  guaiFENesin (MUCINEX) 600 MG 12 hr tablet Take 600 mg by mouth 2 (two) times daily as needed for to loosen phlegm.    [provider]  hydrochlorothiazide (HYDRODIURIL) 50 MG tablet Take 50 mg by mouth daily. 03/09/17   [provider]  lisinopril (PRINIVIL,ZESTRIL) 40 MG tablet Take 40 mg by mouth daily. 03/05/17   [provider]  Multiple Vitamin (MULTIVITAMIN WITH MINERALS) TABS tablet Take 1 tablet by mouth daily.    [provider]  omeprazole (PRILOSEC) 20 MG capsule Take 20 mg by mouth daily. 02/17/17   [provider]  ondansetron (ZOFRAN ODT) 8 MG disintegrating tablet Take 1 tablet (8 mg total) by mouth every 8 (eight) hours as needed for nausea. 08/15/18   Derwood KaplanNanavati, Reshad Saab, MD  ondansetron (ZOFRAN ODT) 8 MG disintegrating tablet Take 1 tablet (8 mg total) by mouth every 8 (eight) hours as needed for nausea. 08/15/18   Derwood KaplanNanavati, Nicolasa Milbrath, MD  polyethylene glycol (MIRALAX / GLYCOLAX) packet Take 17 g by mouth daily. 08/15/18   Derwood KaplanNanavati, Renardo Cheatum, MD  potassium chloride SA (K-DUR,KLOR-CON) 20 MEQ tablet Take 1 tablet (20 mEq total) by mouth daily. Patient not taking: Reported on 03/16/2017 02/12/17   Cathren LaineSteinl, Kevin, MD  Probiotic Product (PROBIOTIC PO) Take 1 tablet by mouth 3 (three) times daily.    [provider]  sodium chloride (OCEAN) 0.65 %  SOLN nasal spray Place 1 spray into both nostrils daily as needed for congestion.    [provider]  SUPER B COMPLEX/C PO Take 1 tablet by mouth daily.    [provider]    Family History History reviewed. No pertinent family history.  Social History Social History   Tobacco Use  . Smoking status: Former Games developermoker  . Smokeless tobacco: Never Used  Substance Use Topics  . Alcohol use: Yes    Comment: social  . Drug use: No     Allergies   Sulfa antibiotics   Review of Systems Review of Systems  Constitutional: Positive  for activity change.  Respiratory: Negative for shortness of breath.   Cardiovascular: Negative for chest pain.  Gastrointestinal: Positive for abdominal pain, constipation, nausea and vomiting. Negative for abdominal distention.  Genitourinary: Positive for dysuria and flank pain. Negative for difficulty urinating, vaginal discharge and vaginal pain.  Allergic/Immunologic: Negative for immunocompromised state.  All other systems reviewed and are negative.    Physical Exam Updated Vital Signs BP (!) 163/107   Pulse 87   Temp 97.6 F (36.4 C) (Oral)   Resp 18   Ht 5\' 7"  (1.702 m)   Wt 116.6 kg   SpO2 98%   BMI 40.25 kg/m   Physical Exam  Constitutional: She is oriented to person, place, and time. She appears well-developed.  HENT:  Head: Normocephalic and atraumatic.  Eyes: EOM are normal.  Neck: Normal range of motion. Neck supple.  Cardiovascular: Normal rate.  Pulmonary/Chest: Effort normal.  Abdominal: Soft. Bowel sounds are normal. There is tenderness. There is no rebound and no guarding.  Patient has lower quadrant tenderness without any rebound or guarding.  Neurological: She is alert and oriented to person, place, and time.  Skin: Skin is warm and dry.  Nursing note and vitals reviewed.    ED Treatments / Results  Labs (all labs ordered are listed, but only abnormal results are displayed) Labs Reviewed  CBC - Abnormal; Notable for the following components:      Result Value   WBC 11.8 (*)    All other components within normal limits  COMPREHENSIVE METABOLIC PANEL - Abnormal; Notable for the following components:   Glucose, Bld 134 (*)    BUN 33 (*)    Creatinine, Ser 1.01 (*)    All other components within normal limits  URINALYSIS, ROUTINE W REFLEX MICROSCOPIC - Abnormal; Notable for the following components:   Color, Urine AMBER (*)    Hgb urine dipstick MODERATE (*)    Protein, ur 30 (*)    Nitrite POSITIVE (*)    Bacteria, UA RARE (*)    All other  components within normal limits  URINE CULTURE    EKG None  Radiology Dg Abd 2 Views  Result Date: 08/15/2018 CLINICAL DATA:  Constipation for 3 days EXAM: ABDOMEN - 2 VIEW COMPARISON:  None. FINDINGS: Patchy high-density material that is likely enteric. Stool volume is moderate. No distal stool or rectal impaction. Negative for bowel obstruction. No concerning mass effect or gas collection. Reticulation in the lower lungs, less prominent on the second view, likely interstitial crowding/atelectasis. IMPRESSION: 1. Moderate stool volume without obstruction or rectal impaction. 2. Reticulation at the lung bases is likely interstitial crowding or mild atelectasis. Please correlate for acute respiratory symptoms. Electronically Signed   By: Marnee SpringJonathon  Watts M.D.   On: 08/15/2018 08:06    Procedures Procedures (including critical care time)  Medications Ordered in ED Medications  ondansetron (ZOFRAN-ODT) disintegrating tablet 8 mg (8 mg Oral Given 08/15/18 0742)  cefTRIAXone (ROCEPHIN) 1 g in sodium chloride 0.9 % 100 mL IVPB (0 g Intravenous Stopped 08/15/18 0813)  sodium chloride 0.9 % bolus 1,000 mL (1,000 mLs Intravenous New Bag/Given 08/15/18 0744)     Initial Impression / Assessment and Plan / ED Course  I have reviewed the triage vital signs and the nursing notes.  Pertinent labs & imaging results that were available during my care of the patient were reviewed by me and considered in my medical decision making (see chart for details).  Clinical Course as of Aug 16 939  Fri Aug 15, 2018  9811 Results from the ER workup discussed with the patient face to face and all questions answered to the best of my ability.  Patient is still having abdominal discomfort, but she has passed p.o. Challenge.  White count is slightly elevated.  Patient is comfortable with the plan of conservative management with treatment for pyelonephritis along with nausea and constipation.  She will return to the  ER if her symptoms get worse.  Nitrite(!): POSITIVE [AN]  0939 Repeat abdominal exam is unchanged.   [AN]    Clinical Course User Index [AN] Derwood Kaplan, MD   57 year old female comes in with chief complaint of abdominal discomfort.  She has had some constipation over the past couple of days, however she does not have any history of abdominal surgery and her ABD tenderness, although generalized in the lower quadrant, does not reveal any peritoneal signs and the abdomen is not distended.  Additionally, she is having now left-sided flank tenderness, along with some discomfort with urination and a urine that is nitrite positive.  Patient does not have any risk factors for STDs and denies any vaginal discharge or bleeding.  I suspect that patient actually is having pyelonephritis at this time. Acute abdominal series does not show any significant air-fluid levels or clear evidence of obstruction.  We will give IV antibiotics and start oral challenge.   Final Clinical Impressions(s) / ED Diagnoses   Final diagnoses:  Constipated  Pyelonephritis    ED Discharge Orders         Ordered    cephALEXin (KEFLEX) 500 MG capsule  2 times daily,   Status:  Discontinued     08/15/18 0936    ondansetron (ZOFRAN ODT) 8 MG disintegrating tablet  Every 8 hours PRN     08/15/18 0936    acetaminophen (TYLENOL 8 HOUR) 650 MG CR tablet  Every 8 hours PRN     08/15/18 0936    polyethylene glycol (MIRALAX / GLYCOLAX) packet  Daily     08/15/18 0939    ondansetron (ZOFRAN ODT) 8 MG disintegrating tablet  Every 8 hours PRN     08/15/18 0939    cephALEXin (KEFLEX) 500 MG capsule  2 times daily     08/15/18 9147           Derwood Kaplan, MD 08/15/18 0940

## 2018-08-15 NOTE — ED Notes (Signed)
Pt updated on plan of care

## 2018-08-15 NOTE — ED Notes (Signed)
Patient transported to X-ray 

## 2018-08-15 NOTE — ED Triage Notes (Signed)
Pt complains of constipation for three days, she states she had diarrhea for a few days and then today she vomited, pt states she's treating herself for a UTI and her abdomen is distended

## 2018-08-15 NOTE — Discharge Instructions (Signed)
It appears that you have a kidney infection. Take the medications prescribed for the infection and for pain and nausea.  Please return to the ER if your symptoms worsen; you have increased pain, fevers, chills, inability to keep any medications down, confusion. Otherwise see the outpatient doctor as requested.

## 2018-08-17 LAB — URINE CULTURE

## 2018-08-18 ENCOUNTER — Telehealth: Payer: Self-pay | Admitting: Emergency Medicine

## 2018-08-18 NOTE — Telephone Encounter (Signed)
Post ED Visit - Positive Culture Follow-up: Successful Patient Follow-Up  Culture assessed and recommendations reviewed by:  []  Enzo BiNathan Batchelder, Pharm.D. []  Celedonio MiyamotoJeremy Frens, Pharm.D., BCPS AQ-ID []  Garvin FilaMike Maccia, Pharm.D., BCPS []  Georgina PillionElizabeth Martin, [address]Pharm[address]D., BCPS []  AlfarataMinh Pham, [place]arm[place]., BCPS, AAHIVP []  Estella HuskMichelle Turner, Pharm.D., BCPS, AAHIVP [x]  Lysle Pearlachel Rumbarger, PharmD, BCPS []  Phillips Climeshuy Dang, PharmD, BCPS []  Agapito GamesAlison Masters, PharmD, BCPS []  Verlan FriendsErin Deja, PharmD  Positive urine culture  []  Patient discharged without antimicrobial prescription and treatment is now indicated [x]  Organism is resistant to prescribed ED discharge antimicrobial []  Patient with positive blood cultures  Changes discussed with ED provider: Michela PitcherMina Fawze PA New antibiotic prescription stop cephalexin, start Macrobid 100mg  po bid x 7 days [facility] [phone]  Contacted patient, 08/18/17 1159  Berle MullMiller, Katelynd Blauvelt 08/18/2018, 11:58 AM

## 2018-08-18 NOTE — Progress Notes (Signed)
ED Antimicrobial Stewardship Positive Culture Follow Up   Christie BeckersJanet I Wilkerson is an 57 y.o. female who [facility] with a chief complaint of  Chief Complaint  Patient presents with  . Constipation    Recent Results (from the past 720 hour(s))  Urine culture     Status: Abnormal   Collection Time: 08/15/18  6:46 AM  Result Value Ref Range Status   Specimen Description   Final    URINE, CLEAN CATCH Performed at [facility], [address] [place], [place] [address]    Special Requests   Final    NONE Performed at [facility], [address] [place], [place] [address]    Culture 70,000 COLONIES/mL STAPHYLOCOCCUS SAPROPHYTICUS (A)  Final   Report Status 08/17/2018 FINAL  Final   Organism ID, Bacteria STAPHYLOCOCCUS SAPROPHYTICUS (A)  Final      Susceptibility   Staphylococcus saprophyticus - MIC*    CIPROFLOXACIN <=0.5 SENSITIVE Sensitive     GENTAMICIN <=0.5 SENSITIVE Sensitive     NITROFURANTOIN <=16 SENSITIVE Sensitive     OXACILLIN 2 RESISTANT Resistant     TETRACYCLINE <=1 SENSITIVE Sensitive     VANCOMYCIN <=0.5 SENSITIVE Sensitive     TRIMETH/SULFA <=10 SENSITIVE Sensitive     CLINDAMYCIN <=0.25 SENSITIVE Sensitive     RIFAMPIN <=0.5 SENSITIVE Sensitive     Inducible Clindamycin NEGATIVE Sensitive     * 70,000 COLONIES/mL STAPHYLOCOCCUS SAPROPHYTICUS    [x]  Treated with cephalexin, organism resistant to prescribed antimicrobial []  Patient discharged originally without antimicrobial agent and treatment is now indicated  New antibiotic prescription: DC cephalexin, start macrobid 100mg  PO BID x 7 days  ED Provider: Michela PitcherMina Fawze, PA   Daniell Mancinas, Drake LeachRachel Lynn 08/18/2018, 9:54 AM Clinical Pharmacist Monday - Friday phone -  [phone] Saturday - Sunday phone - [phone]

## 2018-08-22 DIAGNOSIS — R109 Unspecified abdominal pain: Secondary | ICD-10-CM | POA: Diagnosis not present

## 2018-08-22 DIAGNOSIS — N390 Urinary tract infection, site not specified: Secondary | ICD-10-CM | POA: Diagnosis not present

## 2018-08-22 DIAGNOSIS — K5900 Constipation, unspecified: Secondary | ICD-10-CM | POA: Diagnosis not present

## 2018-09-09 DIAGNOSIS — H35371 Puckering of macula, right eye: Secondary | ICD-10-CM | POA: Diagnosis not present

## 2018-10-01 DIAGNOSIS — H33311 Horseshoe tear of retina without detachment, right eye: Secondary | ICD-10-CM | POA: Diagnosis not present

## 2018-10-01 DIAGNOSIS — Z01818 Encounter for other preprocedural examination: Secondary | ICD-10-CM | POA: Diagnosis not present

## 2018-10-08 DIAGNOSIS — H33311 Horseshoe tear of retina without detachment, right eye: Secondary | ICD-10-CM | POA: Diagnosis not present

## 2018-10-21 DIAGNOSIS — Z09 Encounter for follow-up examination after completed treatment for conditions other than malignant neoplasm: Secondary | ICD-10-CM | POA: Diagnosis not present

## 2018-10-22 DIAGNOSIS — H25811 Combined forms of age-related cataract, right eye: Secondary | ICD-10-CM | POA: Diagnosis not present

## 2018-11-19 DIAGNOSIS — H25812 Combined forms of age-related cataract, left eye: Secondary | ICD-10-CM | POA: Diagnosis not present

## 2018-11-19 DIAGNOSIS — H2512 Age-related nuclear cataract, left eye: Secondary | ICD-10-CM | POA: Diagnosis not present

## 2018-11-25 DIAGNOSIS — M25362 Other instability, left knee: Secondary | ICD-10-CM | POA: Diagnosis not present

## 2018-11-25 DIAGNOSIS — M25562 Pain in left knee: Secondary | ICD-10-CM | POA: Diagnosis not present

## 2019-01-07 ENCOUNTER — Ambulatory Visit (HOSPITAL_COMMUNITY)
Admission: RE | Admit: 2019-01-07 | Discharge: 2019-01-07 | Disposition: A | Discharge status: Home / Self Care | Payer: Federal, State, Local not specified - PPO | Source: Ambulatory Visit | Attending: Cardiology | Admitting: Cardiology

## 2019-01-07 ENCOUNTER — Other Ambulatory Visit (HOSPITAL_COMMUNITY): Payer: Self-pay | Admitting: Sports Medicine

## 2019-01-07 DIAGNOSIS — M79605 Pain in left leg: Secondary | ICD-10-CM | POA: Diagnosis not present

## 2019-01-07 DIAGNOSIS — M79662 Pain in left lower leg: Secondary | ICD-10-CM

## 2019-01-07 DIAGNOSIS — M25562 Pain in left knee: Secondary | ICD-10-CM | POA: Diagnosis not present

## 2019-01-12 DIAGNOSIS — M25562 Pain in left knee: Secondary | ICD-10-CM | POA: Diagnosis not present

## 2019-01-14 DIAGNOSIS — M25562 Pain in left knee: Secondary | ICD-10-CM | POA: Diagnosis not present

## 2019-01-22 DIAGNOSIS — M25569 Pain in unspecified knee: Secondary | ICD-10-CM | POA: Diagnosis not present

## 2019-01-22 DIAGNOSIS — I10 Essential (primary) hypertension: Secondary | ICD-10-CM | POA: Diagnosis not present

## 2019-01-22 DIAGNOSIS — Z6841 Body Mass Index (BMI) 40.0 and over, adult: Secondary | ICD-10-CM | POA: Diagnosis not present

## 2019-01-22 DIAGNOSIS — E876 Hypokalemia: Secondary | ICD-10-CM | POA: Diagnosis not present

## 2019-01-27 DIAGNOSIS — S83222D Peripheral tear of medial meniscus, current injury, left knee, subsequent encounter: Secondary | ICD-10-CM | POA: Diagnosis not present

## 2019-01-27 DIAGNOSIS — S83249A Other tear of medial meniscus, current injury, unspecified knee, initial encounter: Secondary | ICD-10-CM | POA: Insufficient documentation

## 2019-01-27 DIAGNOSIS — M238X2 Other internal derangements of left knee: Secondary | ICD-10-CM | POA: Diagnosis not present

## 2019-02-04 ENCOUNTER — Ambulatory Visit: Payer: Federal, State, Local not specified - PPO | Admitting: Registered"

## 2019-02-04 DIAGNOSIS — M25569 Pain in unspecified knee: Secondary | ICD-10-CM | POA: Diagnosis not present

## 2019-02-04 DIAGNOSIS — R7309 Other abnormal glucose: Secondary | ICD-10-CM | POA: Diagnosis not present

## 2019-02-04 DIAGNOSIS — I10 Essential (primary) hypertension: Secondary | ICD-10-CM | POA: Diagnosis not present

## 2019-02-04 DIAGNOSIS — Z01818 Encounter for other preprocedural examination: Secondary | ICD-10-CM | POA: Diagnosis not present

## 2019-02-05 ENCOUNTER — Encounter: Payer: Self-pay | Admitting: Dietician

## 2019-02-05 ENCOUNTER — Encounter: Payer: Federal, State, Local not specified - PPO | Attending: Family Medicine | Admitting: Dietician

## 2019-02-05 DIAGNOSIS — Z713 Dietary counseling and surveillance: Secondary | ICD-10-CM | POA: Diagnosis not present

## 2019-02-05 NOTE — Patient Instructions (Addendum)
   Try to incorporate more vegetables throughout the week. Maybe try preparing them differently (baking or stir frying versus raw, etc.) or always having some on hand (for example canned or frozen or raw.) Maybe try new vegetables until you find some you love and enjoy eating! Or, pair some vegetables with a dip (such as hummus or guacamole) to sneak more in.   Focus on mealtimes: remember to think about portion sizes (for example starting small, then getting more if still hungry), chewing each bite thoroughly, eating slowly, etc.   Pick out some snacks to have on hand for when you feel cravings. Make sure they are foods you feel good about and enjoy. Use the "Should I Eat?" sheet for ideas and for help in craving situations.   Aim to add 1 more snack into your day between breakfast and lunch. Use the "Snack Suggestions" sheet for ideas if needed.

## 2019-02-05 NOTE — Progress Notes (Signed)
Medical Nutrition Therapy  Appt Start Time: 11:15am End Time: 12:30pm  Primary concerns today: would like to lose weight  Preferred learning style: no preference indicated Learning readiness: contemplating   NUTRITION ASSESSMENT   Anthropometrics  Weight: 275 lbs (pt reported)   Clinical Medical Hx: obesity, sleep apnea, HTN Surgeries: rotator cuff (2007, 2018) Allergies: sulfa drugs Medications: see list  Psychosocial/Lifestyle Pt works as a Consulting civil engineer. Pt lost her first husband but is now engaged to be married again. Pt was very emotional during today's visit, expressing her frustrations with herself for not being more disciplined and letting herself overeat.   24-Hr Dietary Recall First Meal: (6 am) iced coffee + espresso shot + [employer] BEC bagel + extra bacon (or hashbrowns + eggs + double bacon)  Snack: none Second Meal: (2 pm) fast food Snack: none Third Meal: lasagna (or meat + sides) Snack: none Beverages: Coke, water, coffee  Food & Nutrition Related Hx Dietary Hx: Pt states she tries to avoid starches, but her fiance likes pizza and eats it often. Pt states she likes pasta sauce, more so than the pasta. Pt states she dislikes many vegetables, dairy foods, sausage, and whole wheat bread. Pt states her fiance often cooks for them but prepares "unhealthy" foods, often adding in lots of butter and sugar.    Physical Activity  Current average weekly physical activity: walking, activity at work   Estimated Energy Needs Calories: 1800 Carbohydrate: 200g Protein: 135g Fat: 50g   NUTRITION DIAGNOSIS  Excessive oral intake (NI-2.2) related to lack of value for behavior change and food/nutrition-related knowledge deficit as evidenced by intake of high caloric-density foods/beverages at meals/snacks, intake of large portions of foods/beverages, frequent fast food intake, and estimated intake that exceeds estimated energy needs.    NUTRITION INTERVENTION  Nutrition  education (E-1) on the following topics:  . General healthful diet: MyPlate, including all food groups, adding in more vegetables, focusing on fiber, limiting added sugars . Mindfulness and portion control: eating slowly, chewing thoroughly, using smaller plates, start with smaller portions, eat when hungry/stop when full  Handouts Provided Include   MyPlate  Meal Ideas   Snack Suggestions  Should I Eat? flowsheet  Sugary Drinks   Learning Style & Readiness for Change Teaching method utilized: Visual & Auditory  Demonstrated degree of understanding via: Teach Back  Barriers to learning/adherence to lifestyle change: Contemplative Stage of Change (and fiance typically cooks their meals, which are not desirable food choices and pt believes this prevents her from achieving her health goals)   MONITORING & EVALUATION Dietary intake and weekly physical activity prn.  Next Steps  Patient is to contact [facility] for a follow up visit as needed/desired by pt or as recommended per MD.

## 2019-02-13 ENCOUNTER — Other Ambulatory Visit: Payer: Self-pay

## 2019-02-13 ENCOUNTER — Encounter (HOSPITAL_BASED_OUTPATIENT_CLINIC_OR_DEPARTMENT_OTHER): Payer: Self-pay | Admitting: *Deleted

## 2019-02-13 NOTE — Progress Notes (Signed)
Spoke with Stacy Wilkerson after midnight, arrive 530 am 02-19-2019 [facility] to take sip of water: omeprazole, felodipine, metoprolol succinate, bring cpap Driver Stacy Wilkerson fiance Requested [facility],  Has surgery orders in epic Needs bmet

## 2019-02-16 DIAGNOSIS — G4733 Obstructive sleep apnea (adult) (pediatric): Secondary | ICD-10-CM | POA: Diagnosis not present

## 2019-02-17 NOTE — Progress Notes (Signed)
ekg 02-04-2019 [facility] on chart

## 2019-02-19 ENCOUNTER — Encounter (HOSPITAL_BASED_OUTPATIENT_CLINIC_OR_DEPARTMENT_OTHER): Payer: Self-pay

## 2019-02-19 ENCOUNTER — Ambulatory Visit (HOSPITAL_BASED_OUTPATIENT_CLINIC_OR_DEPARTMENT_OTHER)
Admission: RE | Admit: 2019-02-19 | Discharge: 2019-02-19 | Disposition: A | Discharge status: Home / Self Care | Payer: Federal, State, Local not specified - PPO | Attending: Orthopedic Surgery | Admitting: Orthopedic Surgery

## 2019-02-19 ENCOUNTER — Ambulatory Visit (HOSPITAL_BASED_OUTPATIENT_CLINIC_OR_DEPARTMENT_OTHER): Payer: Federal, State, Local not specified - PPO | Admitting: Anesthesiology

## 2019-02-19 ENCOUNTER — Encounter (HOSPITAL_BASED_OUTPATIENT_CLINIC_OR_DEPARTMENT_OTHER)
Admission: RE | Disposition: A | Discharge status: Home / Self Care | Payer: Self-pay | Source: Home / Self Care | Attending: Orthopedic Surgery

## 2019-02-19 ENCOUNTER — Other Ambulatory Visit: Payer: Self-pay

## 2019-02-19 DIAGNOSIS — Z6841 Body Mass Index (BMI) 40.0 and over, adult: Secondary | ICD-10-CM | POA: Insufficient documentation

## 2019-02-19 DIAGNOSIS — Z882 Allergy status to sulfonamides status: Secondary | ICD-10-CM | POA: Diagnosis not present

## 2019-02-19 DIAGNOSIS — M94262 Chondromalacia, left knee: Secondary | ICD-10-CM | POA: Diagnosis not present

## 2019-02-19 DIAGNOSIS — G4730 Sleep apnea, unspecified: Secondary | ICD-10-CM | POA: Diagnosis not present

## 2019-02-19 DIAGNOSIS — Z79899 Other long term (current) drug therapy: Secondary | ICD-10-CM | POA: Diagnosis not present

## 2019-02-19 DIAGNOSIS — I10 Essential (primary) hypertension: Secondary | ICD-10-CM | POA: Diagnosis not present

## 2019-02-19 DIAGNOSIS — X58XXXA Exposure to other specified factors, initial encounter: Secondary | ICD-10-CM | POA: Diagnosis not present

## 2019-02-19 DIAGNOSIS — S83242A Other tear of medial meniscus, current injury, left knee, initial encounter: Secondary | ICD-10-CM | POA: Insufficient documentation

## 2019-02-19 DIAGNOSIS — M659 Synovitis and tenosynovitis, unspecified: Secondary | ICD-10-CM | POA: Diagnosis not present

## 2019-02-19 DIAGNOSIS — K219 Gastro-esophageal reflux disease without esophagitis: Secondary | ICD-10-CM | POA: Diagnosis not present

## 2019-02-19 HISTORY — DX: Sleep apnea, unspecified: G47.30

## 2019-02-19 HISTORY — PX: KNEE ARTHROSCOPY: SHX127

## 2019-02-19 HISTORY — DX: Renal tubulo-interstitial disease, unspecified: N15.9

## 2019-02-19 LAB — BASIC METABOLIC PANEL
Anion gap: 7 (ref 5–15)
BUN: 24 mg/dL — ABNORMAL HIGH (ref 6–20)
CO2: 28 mmol/L (ref 22–32)
Calcium: 9 mg/dL (ref 8.9–10.3)
Chloride: 106 mmol/L (ref 98–111)
Creatinine, Ser: 0.66 mg/dL (ref 0.44–1.00)
GFR calc Af Amer: >60 mL/min (ref >60)
GFR calc non Af Amer: >60 mL/min (ref >60)
Glucose, Bld: 107 mg/dL — ABNORMAL HIGH (ref 70–99)
Potassium: 3.2 mmol/L — ABNORMAL LOW (ref 3.5–5.1)
Sodium: 141 mmol/L (ref 135–145)

## 2019-02-19 SURGERY — ARTHROSCOPY, KNEE
Anesthesia: General | Laterality: Left

## 2019-02-19 MED ORDER — CHLORHEXIDINE GLUCONATE 4 % EX LIQD
60.0000 mL | Freq: Once | CUTANEOUS | Status: DC
  Filled 2019-02-19: qty 118

## 2019-02-19 MED ORDER — FENTANYL CITRATE (PF) 100 MCG/2ML IJ SOLN
25.0000 ug | INTRAMUSCULAR | Status: DC | PRN
  Filled 2019-02-19: qty 1

## 2019-02-19 MED ORDER — DEXAMETHASONE SODIUM PHOSPHATE 10 MG/ML IJ SOLN
INTRAMUSCULAR | Status: DC | PRN
  Administered 2019-02-19: 8 mg via INTRAVENOUS

## 2019-02-19 MED ORDER — ACETAMINOPHEN 500 MG PO TABS
1000.0000 mg | ORAL_TABLET | Freq: Once | ORAL | Status: DC | PRN
  Filled 2019-02-19: qty 2

## 2019-02-19 MED ORDER — ACETAMINOPHEN 160 MG/5ML PO SOLN
1000.0000 mg | Freq: Once | ORAL | Status: DC | PRN
  Filled 2019-02-19: qty 40.6

## 2019-02-19 MED ORDER — LIDOCAINE HCL (CARDIAC) PF 100 MG/5ML IV SOSY
PREFILLED_SYRINGE | INTRAVENOUS | Status: DC | PRN
  Administered 2019-02-19: 50 mg via INTRAVENOUS

## 2019-02-19 MED ORDER — EPHEDRINE SULFATE 50 MG/ML IJ SOLN
INTRAMUSCULAR | Status: DC | PRN
  Administered 2019-02-19: 10 mg via INTRAVENOUS

## 2019-02-19 MED ORDER — FENTANYL CITRATE (PF) 100 MCG/2ML IJ SOLN
INTRAMUSCULAR | Status: AC
  Filled 2019-02-19: qty 2

## 2019-02-19 MED ORDER — SODIUM CHLORIDE 0.9 % IR SOLN
Status: DC | PRN
  Administered 2019-02-19: 6000 mL

## 2019-02-19 MED ORDER — ONDANSETRON 4 MG PO TBDP: 4.0000 mg | ORAL_TABLET | Freq: Three times a day (TID) | ORAL | 0 refills | Status: DC | PRN

## 2019-02-19 MED ORDER — ACETAMINOPHEN 10 MG/ML IV SOLN
1000.0000 mg | Freq: Once | INTRAVENOUS | Status: DC | PRN
  Filled 2019-02-19: qty 100

## 2019-02-19 MED ORDER — CEFAZOLIN SODIUM-DEXTROSE 2-4 GM/100ML-% IV SOLN
INTRAVENOUS | Status: AC
  Filled 2019-02-19: qty 100

## 2019-02-19 MED ORDER — PROPOFOL 10 MG/ML IV BOLUS
INTRAVENOUS | Status: DC | PRN
  Administered 2019-02-19: 300 mg via INTRAVENOUS

## 2019-02-19 MED ORDER — OXYCODONE HCL 5 MG/5ML PO SOLN
5.0000 mg | Freq: Once | ORAL | Status: AC | PRN
  Filled 2019-02-19: qty 5

## 2019-02-19 MED ORDER — FENTANYL CITRATE (PF) 100 MCG/2ML IJ SOLN
INTRAMUSCULAR | Status: DC | PRN
  Administered 2019-02-19 (×3): 50 ug via INTRAVENOUS
  Administered 2019-02-19 (×2): 100 ug via INTRAVENOUS

## 2019-02-19 MED ORDER — EPHEDRINE 5 MG/ML INJ
INTRAVENOUS | Status: AC
  Filled 2019-02-19: qty 10

## 2019-02-19 MED ORDER — DEXAMETHASONE SODIUM PHOSPHATE 10 MG/ML IJ SOLN
INTRAMUSCULAR | Status: AC
  Filled 2019-02-19: qty 1

## 2019-02-19 MED ORDER — BUPIVACAINE HCL 0.25 % IJ SOLN
INTRAMUSCULAR | Status: DC | PRN
  Administered 2019-02-19: 20 mL

## 2019-02-19 MED ORDER — FENTANYL CITRATE (PF) 250 MCG/5ML IJ SOLN
INTRAMUSCULAR | Status: AC
  Filled 2019-02-19: qty 5

## 2019-02-19 MED ORDER — CEFAZOLIN SODIUM-DEXTROSE 1-4 GM/50ML-% IV SOLN
INTRAVENOUS | Status: AC
  Filled 2019-02-19: qty 50

## 2019-02-19 MED ORDER — LACTATED RINGERS IV SOLN
INTRAVENOUS | Status: DC
  Administered 2019-02-19: 09:00:00 via INTRAVENOUS
  Administered 2019-02-19: 1000 mL via INTRAVENOUS
  Filled 2019-02-19: qty 1000

## 2019-02-19 MED ORDER — OXYCODONE HCL 5 MG PO TABS: 5.0000 mg | ORAL_TABLET | ORAL | 0 refills | Status: AC | PRN

## 2019-02-19 MED ORDER — KETOROLAC TROMETHAMINE 30 MG/ML IJ SOLN
INTRAMUSCULAR | Status: DC | PRN
  Administered 2019-02-19: 30 mg via INTRAVENOUS

## 2019-02-19 MED ORDER — CEFAZOLIN SODIUM-DEXTROSE 2-4 GM/100ML-% IV SOLN
2.0000 g | INTRAVENOUS | Status: AC
  Administered 2019-02-19: 3 g via INTRAVENOUS
  Filled 2019-02-19: qty 100

## 2019-02-19 MED ORDER — ONDANSETRON HCL 4 MG/2ML IJ SOLN
INTRAMUSCULAR | Status: AC
  Filled 2019-02-19: qty 2

## 2019-02-19 MED ORDER — MIDAZOLAM HCL 5 MG/5ML IJ SOLN
INTRAMUSCULAR | Status: DC | PRN
  Administered 2019-02-19: 2 mg via INTRAVENOUS

## 2019-02-19 MED ORDER — ONDANSETRON HCL 4 MG/2ML IJ SOLN
INTRAMUSCULAR | Status: DC | PRN
  Administered 2019-02-19: 4 mg via INTRAVENOUS

## 2019-02-19 MED ORDER — PROPOFOL 500 MG/50ML IV EMUL
INTRAVENOUS | Status: AC
  Filled 2019-02-19: qty 50

## 2019-02-19 MED ORDER — MIDAZOLAM HCL 2 MG/2ML IJ SOLN
INTRAMUSCULAR | Status: AC
  Filled 2019-02-19: qty 2

## 2019-02-19 MED ORDER — OXYCODONE HCL 5 MG PO TABS
5.0000 mg | ORAL_TABLET | Freq: Once | ORAL | Status: AC | PRN
  Administered 2019-02-19: 5 mg via ORAL
  Filled 2019-02-19: qty 1

## 2019-02-19 MED ORDER — OXYCODONE HCL 5 MG PO TABS
ORAL_TABLET | ORAL | Status: AC
  Filled 2019-02-19: qty 1

## 2019-02-19 SURGICAL SUPPLY — 50 items
APL SKNCLS STERI-STRIP NONHPOA (GAUZE/BANDAGES/DRESSINGS) ×1
BANDAGE ACE 6X5 VEL STRL LF (GAUZE/BANDAGES/DRESSINGS) ×2 IMPLANT
BANDAGE ELASTIC 6 VELCRO ST LF (GAUZE/BANDAGES/DRESSINGS) ×2 IMPLANT
BANDAGE ESMARK 6X9 LF (GAUZE/BANDAGES/DRESSINGS) IMPLANT
BENZOIN TINCTURE PRP APPL 2/3 (GAUZE/BANDAGES/DRESSINGS) ×2 IMPLANT
BLADE CUDA GRT WHITE 3.5 (BLADE) IMPLANT
BLADE CUTTER GATOR 3.5 (BLADE) IMPLANT
BLADE GREAT WHITE 4.2 (BLADE) IMPLANT
BNDG CMPR 9X6 STRL LF SNTH (GAUZE/BANDAGES/DRESSINGS)
BNDG ESMARK 6X9 LF (GAUZE/BANDAGES/DRESSINGS)
COVER WAND RF STERILE (DRAPES) ×2 IMPLANT
CUFF TOURNIQUET SINGLE 34IN LL (TOURNIQUET CUFF) ×2 IMPLANT
DISSECTOR 4.0MM X 13CM (MISCELLANEOUS) ×2 IMPLANT
DRAPE ARTHROSCOPY W/POUCH 114 (DRAPES) ×2 IMPLANT
DRAPE U-SHAPE 47X51 STRL (DRAPES) ×2 IMPLANT
DRSG PAD ABDOMINAL 8X10 ST (GAUZE/BANDAGES/DRESSINGS) ×2 IMPLANT
DURAPREP 26ML APPLICATOR (WOUND CARE) ×2 IMPLANT
GAUZE SPONGE 4X4 12PLY STRL (GAUZE/BANDAGES/DRESSINGS) ×2 IMPLANT
GAUZE XEROFORM 1X8 LF (GAUZE/BANDAGES/DRESSINGS) ×2 IMPLANT
GLOVE BIO SURGEON STRL SZ7.5 (GLOVE) ×2 IMPLANT
GLOVE BIOGEL PI IND STRL 8 (GLOVE) ×1 IMPLANT
GLOVE BIOGEL PI INDICATOR 8 (GLOVE) ×1
GOWN STRL REUS W/TWL LRG LVL3 (GOWN DISPOSABLE) IMPLANT
IMMOBILIZER KNEE 24 ADJ (MISCELLANEOUS) ×2 IMPLANT
IMMOBILIZER KNEE 24 THIGH 36 (MISCELLANEOUS) ×1 IMPLANT
IMMOBILIZER KNEE 24 UNIV (MISCELLANEOUS) ×2
IV NS IRRIG 3000ML ARTHROMATIC (IV SOLUTION) ×4 IMPLANT
KIT ROOT REPAIR MEINISCAL PEEK (Anchor) ×1 IMPLANT
KIT TURNOVER CYSTO (KITS) ×2 IMPLANT
KNEE WRAP E Z 3 GEL PACK (MISCELLANEOUS) ×2 IMPLANT
MANIFOLD NEPTUNE II (INSTRUMENTS) ×2 IMPLANT
MEINISCAL ROOT REPAIR KIT PEEK (Anchor) ×2 IMPLANT
PACK ARTHROSCOPY DSU (CUSTOM PROCEDURE TRAY) ×2 IMPLANT
PACK BASIN DAY SURGERY FS (CUSTOM PROCEDURE TRAY) ×2 IMPLANT
PAD ARMBOARD 7.5X6 YLW CONV (MISCELLANEOUS) IMPLANT
PADDING CAST COTTON 6X4 STRL (CAST SUPPLIES) ×2 IMPLANT
PROBE BIPOLAR 50 DEGREE SUCT (MISCELLANEOUS) IMPLANT
PROBE BIPOLAR ATHRO 135MM 90D (MISCELLANEOUS) IMPLANT
SET ARTHROSCOPY TUBING (MISCELLANEOUS) ×2
SET ARTHROSCOPY TUBING LN (MISCELLANEOUS) ×1 IMPLANT
SHAVER 4.2 MM LANZA 9391A (BLADE) ×2 IMPLANT
STRIP CLOSURE SKIN 1/2X4 (GAUZE/BANDAGES/DRESSINGS) ×2 IMPLANT
SUT MNCRL AB 3-0 PS2 18 (SUTURE) ×2 IMPLANT
SUT VIC AB 2-0 CT1 (SUTURE) ×4 IMPLANT
SYR CONTROL 10ML LL (SYRINGE) ×2 IMPLANT
TOWEL OR 17X26 10 PK STRL BLUE (TOWEL DISPOSABLE) ×2 IMPLANT
TUBE CONNECTING 12X1/4 (SUCTIONS) ×2 IMPLANT
TUBING ARTHROSCOPY IRRIG 16FT (MISCELLANEOUS) ×2 IMPLANT
WAND 30 DEG SABER W/CORD (SURGICAL WAND) IMPLANT
WATER STERILE IRR 500ML POUR (IV SOLUTION) ×2 IMPLANT

## 2019-02-19 NOTE — Op Note (Signed)
Surgery Date: 02/19/2019  Surgeon(s): Yolonda Kida, MD  ANESTHESIA:  general, local  FLUIDS: Per anesthesia record.   ESTIMATED BLOOD LOSS: minimal  PREOPERATIVE DIAGNOSES:  1. Left knee posterior root medial meniscus tear 2. Left  knee synovitis 3.  Left Knee chondromalacia  POSTOPERATIVE DIAGNOSES:  same  PROCEDURES PERFORMED:  1. Left knee arthroscopy with limited synovectomy 2. Left knee arthroscopy with arthroscopic meidal meniscus repair 3.  Left knee arthroscopy with arthroscopic chondroplasty medial femoral condyle and medial tibial plateau.   DESCRIPTION OF PROCEDURE: Stacy Wilkerson is a 58 y.o.-year-old female with chronic left knee pain.  She was noted on preoperative MRI to have a posterior horn medial meniscus root tear.  We counseled her on surgical management given the propensity of this injury to move forward with accelerated arthritis.  She is in agreement with that plan.  She presents today for left knee medial meniscus root repair.  Full discussion held regarding risks benefits alternatives and complications related surgical intervention. Conservative care options reviewed. All questions answered.  The patient was identified in the preoperative holding area and the operative extremity was marked. The patient was brought to the operating room and transferred to operating table in a supine position. Satisfactory general anesthesia was induced by anesthesiology.    Standard anterolateral, anteromedial arthroscopy portals were obtained. The anteromedial portal was obtained with a spinal needle for localization under direct visualization with subsequent diagnostic findings.   Anteromedial and anterolateral chambers: moderate synovitis. The synovitis was debrided with a 4.5 mm full radius shaver through both the anteromedial and lateral portals.   Suprapatellar pouch and gutters: moderate synovitis or debris. Patella chondral surface: Grade 2 Trochlear chondral  surface: Grade 0 Patellofemoral tracking: midline Medial meniscus: Radial tear was noted just about 5 mm medial to the root attachment.  This was complete to the capsule.  There was extrusion of the meniscus noted.  There was also some free edge tearing noted along the mid body.  Medial femoral condyle flexion bearing surface: Grade 3 Medial femoral condyle extension bearing surface: Grade 3 Medial tibial plateau: Grade 2 Anterior cruciate ligament:stable Posterior cruciate ligament:stable Lateral meniscus: intact.   Lateral femoral condyle flexion bearing surface: Grade 1 Lateral femoral condyle extension bearing surface: Grade 1 Lateral tibial plateau: Grade 1  When began the medial meniscus repair by performing a medial eminence osteoplasty.  This was accomplished by taking down just the medial aspect of the medial tibial eminence.  This opened up the access to the posterior root.  We then used the Arthrex drill guide to pass a drill tunnel with the 5 mm flip cutter into the root attachment for the medial meniscus.  Once this was in an acceptable location we then flipped the 5 mm flip cutter and drilled a shallow bone socket to receive the root.  We then used the meniscal scorpion to pass to self locking luggage tag style sutures at the posterior horn root tear.  We then moved to secure the free ends of the sutures through the tibial tunnel.  This was accomplished by using a 4.75 mm all peek Arthrex swivel lock anchor.  This was placed into the anterior medial aspect of the proximal tibia.  After the repair was complete we then reentered the knee and probed the meniscus which was found to be stable.  After completion of synovectomy, diagnostic exam, and debridements as described, all compartments were checked and no residual debris remained. Hemostasis was achieved with the cautery wand. The  portals were approximated with buried monocryl. All excess fluid was expressed from the joint.  Xeroform  sterile gauze dressings were applied followed by Ace bandage and ice pack.   DISPOSITION: The patient was awakened from general anesthetic, extubated, taken to the recovery room in medically stable condition, no apparent complications. The patient may be weightbearing as tolerated to the operative lower extremity.  She will be touchdown weightbearing with crutches.  She will be in a knee immobilizer for 2 weeks and then we will transition her to a Bledsoe hinged brace at the 2-week postoperative appointment.  She will begin physical therapy immediately.  She will ice the knee and take aspirin for 6 weeks for DVT prophylaxis.  I will see her back in the office in 2 weeks.

## 2019-02-19 NOTE — H&P (Signed)
ORTHOPAEDIC H and P  REQUESTING PHYSICIAN: Yolonda Kida, MD  PCP:  Farris Has, MD  Chief Complaint: Left medial meniscus tear  HPI: Stacy Wilkerson is a 58 y.o. female who complains of persistent pain and recalcitrant symptoms with the above injury.  She presents today for arthroscopic root repair of the medial meniscus left knee.  We have previously reviewed the risk, benefits, and indications of this procedure.  She has provided informed consent to proceed.  No new complaints today.  Past Medical History:  Diagnosis Date  . Hypertension   . Kidney infection 2019  . Sleep apnea    cpap does not know settings   Past Surgical History:  Procedure Laterality Date  . EYE SURGERY     right eye retinal tear surgery, cataracts both eyes  . ROTATOR CUFF REPAIR Right    x 2   Social History   Socioeconomic History  . Marital status: Married    Spouse name: Not on file  . Number of children: Not on file  . Years of education: Not on file  . Highest education level: Not on file  Occupational History  . Not on file  Social Needs  . Financial resource strain: Not on file  . Food insecurity:    Worry: Not on file    Inability: Not on file  . Transportation needs:    Medical: Not on file    Non-medical: Not on file  Tobacco Use  . Smoking status: Never Smoker  . Smokeless tobacco: Never Used  Substance and Sexual Activity  . Alcohol use: Yes    Comment: social  . Drug use: No  . Sexual activity: Not on file  Lifestyle  . Physical activity:    Days per week: Not on file    Minutes per session: Not on file  . Stress: Not on file  Relationships  . Social connections:    Talks on phone: Not on file    Gets together: Not on file    Attends religious service: Not on file    Active member of club or organization: Not on file    Attends meetings of clubs or organizations: Not on file    Relationship status: Not on file  Other Topics Concern  . Not on file    Social History Narrative  . Not on file   History reviewed. No pertinent family history. Allergies  Allergen Reactions  . Sulfa Antibiotics     unknown   Prior to Admission medications   Medication Sig Start Date End Date Taking? Authorizing Provider  Cholecalciferol (VITAMIN D) 2000 units CAPS Take 2,000 Units by mouth daily.   Yes [provider]  felodipine (PLENDIL) 5 MG 24 hr tablet Take 5 mg by mouth daily.   Yes [provider]  FIBER PO Take 2 tablets by mouth daily.   Yes [provider]  hydrochlorothiazide (HYDRODIURIL) 50 MG tablet Take 25 mg by mouth daily.  03/09/17  Yes [provider]  lisinopril (PRINIVIL,ZESTRIL) 40 MG tablet Take 40 mg by mouth daily. 03/05/17  Yes [provider]  metoprolol succinate (TOPROL-XL) 100 MG 24 hr tablet Take 100 mg by mouth daily. Take with or immediately following a meal.   Yes [provider]  Multiple Vitamin (MULTIVITAMIN WITH MINERALS) TABS tablet Take 1 tablet by mouth daily.   Yes [provider]  omeprazole (PRILOSEC) 20 MG capsule Take 20 mg by mouth daily. 02/17/17  Yes [provider]  potassium chloride SA (K-DUR,KLOR-CON) 20 MEQ tablet Take 1 tablet (20 mEq total) by mouth daily. Patient taking differently: Take 20 mEq by mouth 2 (two) times daily.  02/12/17  Yes Cathren Laine, MD  SUPER B COMPLEX/C PO Take 1 tablet by mouth daily.   Yes [provider]  acetaminophen (TYLENOL 8 HOUR) 650 MG CR tablet Take 1 tablet (650 mg total) by mouth every 8 (eight) hours as needed. 08/15/18   Derwood Kaplan, MD   No results found.  Positive ROS: All other systems have been reviewed and were otherwise negative with the exception of those mentioned in the HPI and as above.  Physical Exam: General: Alert, no acute distress Cardiovascular: No pedal edema Respiratory: No cyanosis, no use of accessory musculature GI: No organomegaly, abdomen is soft and  non-tender Skin: No lesions in the area of chief complaint Neurologic: Sensation intact distally Psychiatric: Patient is competent for consent with normal mood and affect Lymphatic: No axillary or cervical lymphadenopathy   Assessment: Left knee medial meniscus root tear  Plan: -Plan for arthroscopic repair versus possible meniscectomy on the left knee today. -We again reviewed the risk, benefits, and indications of this procedure at length including but not limited to bleeding, infection, damage to surrounding structures, development of arthritis, failure of repair, need for further surgery, the risk of blood clots, and risk of anesthesia.  She has provided informed consent to proceed. -She will be placed in a T ROM style knee brace postoperatively.  She will discharge home from PACU.  We will see her back in the office in 2 weeks.    Yolonda Kida, MD Cell [phone]    02/19/2019 7:20 AM

## 2019-02-19 NOTE — Anesthesia Procedure Notes (Signed)
Procedure Name: LMA Insertion Date/Time: 02/19/2019 7:42 AM Performed by: Shanon Payor, CRNA Pre-anesthesia Checklist: Patient identified, Emergency Drugs available, Suction available, Patient being monitored and Timeout performed Patient Re-evaluated:Patient Re-evaluated prior to induction Oxygen Delivery Method: Circle system utilized Preoxygenation: Pre-oxygenation with 100% oxygen Induction Type: IV induction LMA: LMA inserted LMA Size: 4.0 Number of attempts: 1 Placement Confirmation: positive ETCO2 and breath sounds checked- equal and bilateral Tube secured with: Tape Dental Injury: Teeth and Oropharynx as per pre-operative assessment

## 2019-02-19 NOTE — Anesthesia Preprocedure Evaluation (Addendum)
Anesthesia Evaluation  Patient identified by MRN, date of birth, ID band Patient awake    Reviewed: Allergy & Precautions, NPO status , Patient's Chart, lab work & pertinent test results, reviewed documented beta blocker date and time   History of Anesthesia Complications Negative for: history of anesthetic complications  Airway Mallampati: II  TM Distance: >3 FB Neck ROM: Full    Dental  (+) Dental Advisory Given   Pulmonary sleep apnea ,    breath sounds clear to auscultation       Cardiovascular hypertension, Pt. on medications and Pt. on home beta blockers  Rhythm:Regular     Neuro/Psych    GI/Hepatic Neg liver ROS, GERD  Medicated and Controlled,  Endo/Other  Morbid obesity  Renal/GU negative Renal ROS     Musculoskeletal Left medial meniscus tear   Abdominal   Peds  Hematology   Anesthesia Other Findings   Reproductive/Obstetrics                            Anesthesia Physical Anesthesia Plan  ASA: III  Anesthesia Plan: General   Post-op Pain Management:    Induction: Intravenous  PONV Risk Score and Plan: 3 and Ondansetron and Dexamethasone  Airway Management Planned: LMA and Oral ETT  Additional Equipment: None  Intra-op Plan:   Post-operative Plan: Extubation in OR  Informed Consent: I have reviewed the patients History and Physical, chart, labs and discussed the procedure including the risks, benefits and alternatives for the proposed anesthesia with the patient or authorized representative who has indicated his/her understanding and acceptance.     Dental advisory given  Plan Discussed with: CRNA and Surgeon  Anesthesia Plan Comments:         Anesthesia Quick Evaluation

## 2019-02-19 NOTE — Transfer of Care (Signed)
Immediate Anesthesia Transfer of Care Note  Patient: Stacy Wilkerson  Procedure(s) Performed: Left knee arthroscopy posterior root repair (Left )  Patient Location: PACU  Anesthesia Type:General  Level of Consciousness: awake, alert  and oriented  Airway & Oxygen Therapy: Patient Spontanous Breathing and Patient connected to nasal cannula oxygen  Post-op Assessment: Report given to RN and Post -op Vital signs reviewed and stable  Post vital signs: Reviewed and stable  Last Vitals:  Vitals Value Taken Time  BP    Temp    Pulse 72 02/19/2019  9:06 AM  Resp    SpO2 95 % 02/19/2019  9:06 AM  Vitals shown include unvalidated device data.  Last Pain:  Vitals:   02/19/19 0618  TempSrc:   PainSc: 5       Patients Stated Pain Goal: 6 (02/19/19 0712)  Complications: No apparent anesthesia complications

## 2019-02-19 NOTE — Discharge Instructions (Signed)
Orthopedic discharge instructions: -Plan will be for 50% weightbearing with crutches to the left lower extremity in extension.  She should maintain the knee immobilizer while sleeping and while weightbearing and throughout the day. -She may remove the bandages on postoperative day 3 and begin showering with the knee in extension. -For mild to moderate pain she should take Tylenol and/or Advil as directed around-the-clock.  Oxycodone will be used for breakthrough pain.  -Apply ice to the left knee for 20 to 30 minutes at a time throughout the day. -Return to see Dr. Aundria Rud in 2 weeks for routine wound check.  Call your surgeon if you experience:   1.  Fever over 101.0. 2.  Inability to urinate. 3.  Nausea and/or vomiting. 4.  Extreme swelling or bruising at the surgical site. 5.  Continued bleeding from the incision. 6.  Increased pain, redness or drainage from the incision. 7.  Problems related to your pain medication. 8. Any change in color, movement and/or sensation 9. Any problems and/or concerns   Post Anesthesia Home Care Instructions  Activity: Get plenty of rest for the remainder of the day. A responsible individual must stay with you for 24 hours following the procedure.  For the next 24 hours, DO NOT: -Drive a car -Advertising copywriter -Drink alcoholic beverages -Take any medication unless instructed by your physician -Make any legal decisions or sign important papers.  Meals: Start with liquid foods such as gelatin or soup. Progress to regular foods as tolerated. Avoid greasy, spicy, heavy foods. If nausea and/or vomiting occur, drink only clear liquids until the nausea and/or vomiting subsides. Call your physician if vomiting continues.  Special Instructions/Symptoms: Your throat may feel dry or sore from the anesthesia or the breathing tube placed in your throat during surgery. If this causes discomfort, gargle with warm salt water. The discomfort should disappear within  24 hours.  If you had a scopolamine patch placed behind your ear for the management of post- operative nausea and/or vomiting:  1. The medication in the patch is effective for 72 hours, after which it should be removed.  Wrap patch in a tissue and discard in the trash. Wash hands thoroughly with soap and water. 2. You may remove the patch earlier than 72 hours if you experience unpleasant side effects which may include dry mouth, dizziness or visual disturbances. 3. Avoid touching the patch. Wash your hands with soap and water after contact with the patch.      Ibuprofen may be taken after 2:30 pm

## 2019-02-19 NOTE — Brief Op Note (Signed)
02/19/2019  9:13 AM  PATIENT:  Stacy Wilkerson  58 y.o. female  PRE-OPERATIVE DIAGNOSIS:  Left medial meniscus tear  POST-OPERATIVE DIAGNOSIS:  Left medial meniscus tear  PROCEDURE:  Procedure(s) with comments: Left knee arthroscopy posterior root repair (Left) - 90 mins  SURGEON:  Surgeon(s) and Role:    * Aundria Rud, Noah Delaine, MD - Primary  PHYSICIAN ASSISTANT:   ASSISTANTS: none   ANESTHESIA:   local and general  EBL:  10 mL   BLOOD ADMINISTERED:none  DRAINS: none   LOCAL MEDICATIONS USED:  MARCAINE     SPECIMEN:  No Specimen  DISPOSITION OF SPECIMEN:  N/A  COUNTS:  YES  TOURNIQUET:   Total Tourniquet Time Documented: Thigh (Left) - 42 minutes Total: Thigh (Left) - 42 minutes   DICTATION: .Note written in EPIC  PLAN OF CARE: Discharge to home after PACU  PATIENT DISPOSITION:  PACU - hemodynamically stable.   Delay start of Pharmacological VTE agent (>24hrs) due to surgical blood loss or risk of bleeding: not applicable

## 2019-02-20 ENCOUNTER — Encounter (HOSPITAL_BASED_OUTPATIENT_CLINIC_OR_DEPARTMENT_OTHER): Payer: Self-pay | Admitting: Orthopedic Surgery

## 2019-02-24 NOTE — Anesthesia Postprocedure Evaluation (Signed)
Anesthesia Post Note  Patient: LEKHA TUPA  Procedure(s) Performed: Left knee arthroscopy posterior root repair (Left )     Patient location during evaluation: PACU Anesthesia Type: General Level of consciousness: awake and alert Pain management: pain level controlled Vital Signs Assessment: post-procedure vital signs reviewed and stable Respiratory status: spontaneous breathing, nonlabored ventilation, respiratory function stable and patient connected to nasal cannula oxygen Cardiovascular status: blood pressure returned to baseline and stable Postop Assessment: no apparent nausea or vomiting Anesthetic complications: no    Last Vitals:  Vitals:   02/19/19 0930 02/19/19 1030  BP: (!) 156/81 135/78  Pulse: 70 71  Resp: 12 12  Temp:  36.6 C  SpO2: 100% 96%    Last Pain:  Vitals:   02/20/19 1420  TempSrc:   PainSc: 5                  Miral Hoopes

## 2019-04-07 DIAGNOSIS — M25562 Pain in left knee: Secondary | ICD-10-CM | POA: Diagnosis not present

## 2019-04-14 DIAGNOSIS — M25562 Pain in left knee: Secondary | ICD-10-CM | POA: Diagnosis not present

## 2019-04-21 DIAGNOSIS — M25562 Pain in left knee: Secondary | ICD-10-CM | POA: Diagnosis not present

## 2019-04-28 DIAGNOSIS — M25562 Pain in left knee: Secondary | ICD-10-CM | POA: Diagnosis not present

## 2019-05-07 DIAGNOSIS — M25562 Pain in left knee: Secondary | ICD-10-CM | POA: Diagnosis not present

## 2019-05-14 DIAGNOSIS — M25562 Pain in left knee: Secondary | ICD-10-CM | POA: Diagnosis not present

## 2019-05-21 DIAGNOSIS — M25562 Pain in left knee: Secondary | ICD-10-CM | POA: Diagnosis not present

## 2019-05-28 DIAGNOSIS — M25562 Pain in left knee: Secondary | ICD-10-CM | POA: Diagnosis not present

## 2019-06-30 DIAGNOSIS — S83222D Peripheral tear of medial meniscus, current injury, left knee, subsequent encounter: Secondary | ICD-10-CM | POA: Diagnosis not present

## 2019-07-30 DIAGNOSIS — R7309 Other abnormal glucose: Secondary | ICD-10-CM | POA: Diagnosis not present

## 2019-07-30 DIAGNOSIS — I10 Essential (primary) hypertension: Secondary | ICD-10-CM | POA: Diagnosis not present

## 2019-07-30 DIAGNOSIS — Z0000 Encounter for general adult medical examination without abnormal findings: Secondary | ICD-10-CM | POA: Diagnosis not present

## 2019-09-02 DIAGNOSIS — N390 Urinary tract infection, site not specified: Secondary | ICD-10-CM | POA: Diagnosis not present

## 2019-09-02 DIAGNOSIS — R300 Dysuria: Secondary | ICD-10-CM | POA: Diagnosis not present

## 2019-09-17 DIAGNOSIS — B372 Candidiasis of skin and nail: Secondary | ICD-10-CM | POA: Diagnosis not present

## 2019-09-17 DIAGNOSIS — N760 Acute vaginitis: Secondary | ICD-10-CM | POA: Diagnosis not present

## 2019-09-17 DIAGNOSIS — R319 Hematuria, unspecified: Secondary | ICD-10-CM | POA: Diagnosis not present

## 2019-09-17 DIAGNOSIS — N390 Urinary tract infection, site not specified: Secondary | ICD-10-CM | POA: Diagnosis not present

## 2019-12-02 DIAGNOSIS — Z01419 Encounter for gynecological examination (general) (routine) without abnormal findings: Secondary | ICD-10-CM | POA: Diagnosis not present

## 2019-12-02 DIAGNOSIS — Z6841 Body Mass Index (BMI) 40.0 and over, adult: Secondary | ICD-10-CM | POA: Diagnosis not present

## 2019-12-02 DIAGNOSIS — Z1231 Encounter for screening mammogram for malignant neoplasm of breast: Secondary | ICD-10-CM | POA: Diagnosis not present

## 2019-12-03 DIAGNOSIS — Z01419 Encounter for gynecological examination (general) (routine) without abnormal findings: Secondary | ICD-10-CM | POA: Diagnosis not present

## 2019-12-11 ENCOUNTER — Other Ambulatory Visit: Payer: Self-pay | Admitting: Family Medicine

## 2020-01-29 DIAGNOSIS — R7309 Other abnormal glucose: Secondary | ICD-10-CM | POA: Diagnosis not present

## 2020-01-29 DIAGNOSIS — Z6841 Body Mass Index (BMI) 40.0 and over, adult: Secondary | ICD-10-CM | POA: Diagnosis not present

## 2020-01-29 DIAGNOSIS — I10 Essential (primary) hypertension: Secondary | ICD-10-CM | POA: Diagnosis not present

## 2020-03-04 DIAGNOSIS — E876 Hypokalemia: Secondary | ICD-10-CM | POA: Diagnosis not present

## 2020-03-16 ENCOUNTER — Encounter (INDEPENDENT_AMBULATORY_CARE_PROVIDER_SITE_OTHER): Payer: Self-pay | Admitting: Bariatrics

## 2020-03-21 ENCOUNTER — Other Ambulatory Visit: Payer: Self-pay

## 2020-03-21 ENCOUNTER — Encounter (INDEPENDENT_AMBULATORY_CARE_PROVIDER_SITE_OTHER): Payer: Self-pay | Admitting: Bariatrics

## 2020-03-21 ENCOUNTER — Ambulatory Visit (INDEPENDENT_AMBULATORY_CARE_PROVIDER_SITE_OTHER): Payer: Federal, State, Local not specified - PPO | Admitting: Bariatrics

## 2020-03-21 VITALS — BP 125/82 | HR 77 | Temp 98.4°F | Ht 66.0 in | Wt 277.0 lb

## 2020-03-21 DIAGNOSIS — M545 Low back pain, unspecified: Secondary | ICD-10-CM

## 2020-03-21 DIAGNOSIS — I10 Essential (primary) hypertension: Secondary | ICD-10-CM

## 2020-03-21 DIAGNOSIS — Z9989 Dependence on other enabling machines and devices: Secondary | ICD-10-CM | POA: Insufficient documentation

## 2020-03-21 DIAGNOSIS — R5383 Other fatigue: Secondary | ICD-10-CM

## 2020-03-21 DIAGNOSIS — R0602 Shortness of breath: Secondary | ICD-10-CM | POA: Diagnosis not present

## 2020-03-21 DIAGNOSIS — Z9189 Other specified personal risk factors, not elsewhere classified: Secondary | ICD-10-CM

## 2020-03-21 DIAGNOSIS — Z0289 Encounter for other administrative examinations: Secondary | ICD-10-CM

## 2020-03-21 DIAGNOSIS — Z6841 Body Mass Index (BMI) 40.0 and over, adult: Secondary | ICD-10-CM

## 2020-03-21 DIAGNOSIS — R7303 Prediabetes: Secondary | ICD-10-CM | POA: Diagnosis not present

## 2020-03-21 DIAGNOSIS — G4733 Obstructive sleep apnea (adult) (pediatric): Secondary | ICD-10-CM | POA: Diagnosis not present

## 2020-03-21 DIAGNOSIS — E559 Vitamin D deficiency, unspecified: Secondary | ICD-10-CM

## 2020-03-21 DIAGNOSIS — Z1331 Encounter for screening for depression: Secondary | ICD-10-CM | POA: Diagnosis not present

## 2020-03-21 NOTE — Progress Notes (Signed)
Dear Dr. Farris Has,   Thank you for referring Stacy Wilkerson to our clinic. The following note includes my evaluation and treatment recommendations.  Chief Complaint:   OBESITY Stacy Wilkerson (MR# [id number]) is a 59 y.o. female who presents for evaluation and treatment of obesity and related comorbidities. Current BMI is Body mass index is 44.71 kg/m.Stacy Wilkerson has been struggling with her weight for many years and has been unsuccessful in either losing weight, maintaining weight loss, or reaching her healthy weight goal.  Stacy Wilkerson is currently in the action stage of change and ready to dedicate time achieving and maintaining a healthier weight. Stacy Wilkerson is interested in becoming our patient and working on intensive lifestyle modifications including (but not limited to) diet and exercise for weight loss.   Stacy Wilkerson sometimes likes to cook, but notes cleanup as an obstacle. She sometimes snacks at night. She can skip breakfast or lunch.   Stacy Wilkerson's habits were reviewed today and are as follows: her desired weight loss is 77 lbs, she started gaining weight after the age of 28, her heaviest weight ever was 277 pounds, she snacks frequently in the evenings, she skips breakfast or lunch frequently, she is frequently drinking liquids with calories, she frequently makes poor food choices, she frequently eats larger portions than normal and she struggles with emotional eating.  Depression Screen Stacy Wilkerson's Food and Mood (modified PHQ-9) score was 7.  Depression screen [facility] 2/9 03/21/2020  Decreased Interest 1  Down, Depressed, Hopeless 0  PHQ - 2 Score 1  Altered sleeping 1  Tired, decreased energy 2  Change in appetite 2  Feeling bad or failure about yourself  0  Trouble concentrating 1  Moving slowly or fidgety/restless 0  Suicidal thoughts 0  PHQ-9 Score 7  Difficult doing work/chores Not difficult at all   Subjective:   Other fatigue. [name] denies daytime somnolence and denies waking up still  tired. Stacy Wilkerson generally gets 6 hours of sleep per night, and states that she has generally restful sleep. Snoring is present (has sleep apnea). Apneic episodes are not present since using a machine. Epworth Sleepiness Score is 5.  Shortness of breath on exertion. Stacy Wilkerson notes increasing shortness of breath with certain activities and seems to be worsening over time with weight gain. She notes getting out of breath sooner with activity than she used to. This has gotten worse recently. Stacy Wilkerson denies shortness of breath at rest or orthopnea.  Essential hypertension. [name] is taking HCTZ, felodipine ER, lisinopril, and metoprolol. Blood pressure is controlled with medications.  BP Readings from Last 3 Encounters:  03/21/20 125/82  02/19/19 135/78  08/15/18 (!) 162/98   Lab Results  Component Value Date   CREATININE 0.66 02/19/2019   CREATININE 1.01 (H) 08/15/2018   CREATININE 0.75 03/16/2017   Low back pain, unspecified back pain laterality, unspecified chronicity, unspecified whether sciatica present. Prakriti is taking Turmeric OTC.  Prediabetes. Stacy Wilkerson has a diagnosis of prediabetes based on her elevated HgA1c and was informed this puts her at greater risk of developing diabetes. She continues to work on diet and exercise to decrease her risk of diabetes. She denies nausea or hypoglycemia. A1c was 5.7 on 03/05/2020. She reports appetite varies.  No results found for: HGBA1C No results found for: INSULIN  Vitamin D deficiency. Stacy Wilkerson is taking Vitamin D.  OSA (obstructive sleep apnea). Stacy Wilkerson is using CPAP.  Depression screening. Stacy Wilkerson had a mildly positive depression screen with PHQ-9 score of 7.  At risk for  heart disease. Stacy Wilkerson is at a higher than average risk for cardiovascular disease due to hypertension and obesity. Reviewed: no chest pain on exertion, no dyspnea on exertion, and no swelling of ankles.  Assessment/Plan:   Other fatigue. Stacy Wilkerson does feel that her weight is causing her  energy to be lower than it should be. Fatigue may be related to obesity, depression or many other causes. Labs will be ordered, and in the meanwhile, Staceyann will focus on self care including making healthy food choices, increasing physical activity and focusing on stress reduction.  EKG 12-Lead, T3, T4, free, TSH  Shortness of breath on exertion. Stacy Wilkerson does feel that she gets out of breath more easily that she used to when she exercises. Stacy Wilkerson's shortness of breath appears to be obesity related and exercise induced. She has agreed to work on weight loss and gradually increase exercise to treat her exercise induced shortness of breath. Will continue to monitor closely.  Essential hypertension. Stacy Wilkerson is working on healthy weight loss and exercise to improve blood pressure control. We will watch for signs of hypotension as she continues her lifestyle modifications. She will continue her medications as directed.  Low back pain, unspecified back pain laterality, unspecified chronicity, unspecified whether sciatica present. Stacy Wilkerson will avoid pounding exercises and will gradually increase activities.  Prediabetes. Stacy Wilkerson will continue to work on weight loss, exercise, increasing healthy fats and protein, and decreasing simple carbohydrates to help decrease the risk of diabetes.   Insulin, random  Vitamin D deficiency. Low Vitamin D level contributes to fatigue and are associated with obesity, breast, and colon cancer. VITAMIN D 25 Hydroxy (Vit-D Deficiency, Fractures) level ordered.  OSA (obstructive sleep apnea). Intensive lifestyle modifications are the first line treatment for this issue. We discussed several lifestyle modifications today and she will continue to work on diet, exercise and weight loss efforts. We will continue to monitor. Orders and follow up as documented in patient record. Stacy Wilkerson will use CPAP nightly.  Counseling  Sleep apnea is a condition in which breathing pauses or becomes shallow  during sleep. This happens over and over during the night. This disrupts your sleep and keeps your body from getting the rest that it needs, which can cause tiredness and lack of energy (fatigue) during the day.  Sleep apnea treatment: If you were given a device to open your airway while you sleep, USE IT!  Sleep hygiene:   Limit or avoid alcohol, caffeinated beverages, and cigarettes, especially close to bedtime.   Do not eat a large meal or eat spicy foods right before bedtime. This can lead to digestive discomfort that can make it hard for you to sleep.  Keep a sleep diary to help you and your health care provider figure out what could be causing your insomnia.  . Make your bedroom a dark, comfortable place where it is easy to fall asleep. ? Put up shades or blackout curtains to block light from outside. ? Use a white noise machine to block noise. ? Keep the temperature cool. . Limit screen use before bedtime. This includes: ? Watching TV. ? Using your smartphone, tablet, or computer. . Stick to a routine that includes going to bed and waking up at the same times every day and night. This can help you fall asleep faster. Consider making a quiet activity, such as reading, part of your nighttime routine. . Try to avoid taking naps during the day so that you sleep better at night. . Get out of bed  if you are still awake after 15 minutes of trying to sleep. Keep the lights down, but try reading or doing a quiet activity. When you feel sleepy, go back to bed.  Depression screening. Lylee had a positive depression screening. Depression is commonly associated with obesity and often results in emotional eating behaviors. We will monitor this closely and work on CBT to help improve the non-hunger eating patterns. Referral to Psychology may be required if no improvement is seen as she continues in our clinic.  At risk for heart disease. Maham was given approximately 15 minutes of coronary artery  disease prevention counseling today. She is 59 y.o. female and has risk factors for heart disease including obesity. We discussed intensive lifestyle modifications today with an emphasis on specific weight loss instructions and strategies.   Repetitive spaced learning was employed today to elicit superior memory formation and behavioral change.  Class 3 severe obesity with serious comorbidity and body mass index (BMI) of 40.0 to 44.9 in adult, unspecified obesity type (HCC).  [place] is currently in the action stage of change and her goal is to continue with weight loss efforts. I recommend Adlean begin the structured treatment plan as follows:  She has agreed to the Category 2 Plan.  She will stop all sugary drinks.  We reviewed with the patient labs from 02/19/2019 including CMP and glucose; labs from 08/15/2018 including CMP, CBC, and glucose.  Exercise goals: All adults should avoid inactivity. Some physical activity is better than none, and adults who participate in any amount of physical activity gain some health benefits.   Behavioral modification strategies: increasing lean protein intake, decreasing simple carbohydrates, increasing vegetables, increasing water intake, decreasing eating out, no skipping meals, meal planning and cooking strategies, keeping healthy foods in the home and planning for success.  She was informed of the importance of frequent follow-up visits to maximize her success with intensive lifestyle modifications for her multiple health conditions. She was informed we would discuss her lab results at her next visit unless there is a critical issue that needs to be addressed sooner. Khadeja agreed to keep her next visit at the agreed upon time to discuss these results.  Objective:   Blood pressure 125/82, pulse 77, temperature 98.4 F (36.9 C), temperature source Oral, height 5\' 6"  (1.676 m), weight 277 lb (125.6 kg), SpO2 98 %. Body mass index is 44.71 kg/m.  EKG:  Sinus  Rhythm with a rate of 78 BPM. Low voltage in precordial leads. Voltage criteria for LVH  (R(aVL) exceeds 1.01 mV)  - Voltage criteria w/o ST/T abnormality may be normal. Poor R-wave progression - may be secondary to body habitus. Otherwise normal.   Indirect Calorimeter completed today shows a VO2 of 232 and a REE of 1614.  Her calculated basal metabolic rate is thus her basal metabolic rate is worse than expected.  General: Cooperative, alert, well developed, in no acute distress. HEENT: Conjunctivae and lids unremarkable. Cardiovascular: Regular rhythm.  Lungs: Normal work of breathing. Neurologic: No focal deficits.   Lab Results  Component Value Date   CREATININE 0.66 02/19/2019   BUN 24 (H) 02/19/2019   NA 141 02/19/2019   K 3.2 (L) 02/19/2019   CL 106 02/19/2019   CO2 28 02/19/2019   Lab Results  Component Value Date   ALT 41 08/15/2018   AST 32 08/15/2018   ALKPHOS 66 08/15/2018   BILITOT 0.8 08/15/2018   No results found for: HGBA1C No results found for: INSULIN  No results found for: TSH No results found for: CHOL, HDL, LDLCALC, LDLDIRECT, TRIG, CHOLHDL Lab Results  Component Value Date   WBC 11.8 (H) 08/15/2018   HGB 14.0 08/15/2018   HCT 41.4 08/15/2018   MCV 88.3 08/15/2018   PLT 230 08/15/2018   No results found for: IRON, TIBC, FERRITIN  Attestation Statements:   Reviewed by clinician on day of visit: allergies, medications, problem list, medical history, surgical history, family history, social history, and previous encounter notes.  Fernanda Drum, am acting as Energy manager for [employer], DO   I have reviewed the above documentation for accuracy and completeness, and I agree with the above. Corinna Capra, DO

## 2020-03-22 LAB — T3: T3, Total: 109 ng/dL (ref 71–180)

## 2020-03-22 LAB — VITAMIN D 25 HYDROXY (VIT D DEFICIENCY, FRACTURES): Vit D, 25-Hydroxy: 65 ng/mL (ref 30.0–100.0)

## 2020-03-22 LAB — INSULIN, RANDOM: INSULIN: 11.2 u[IU]/mL (ref 2.6–24.9)

## 2020-03-22 LAB — TSH: TSH: 1.85 u[IU]/mL (ref 0.450–4.500)

## 2020-03-22 LAB — T4, FREE: Free T4: 1.04 ng/dL (ref 0.82–1.77)

## 2020-04-04 ENCOUNTER — Encounter (INDEPENDENT_AMBULATORY_CARE_PROVIDER_SITE_OTHER): Payer: Self-pay | Admitting: Bariatrics

## 2020-04-04 ENCOUNTER — Other Ambulatory Visit: Payer: Self-pay

## 2020-04-04 ENCOUNTER — Ambulatory Visit (INDEPENDENT_AMBULATORY_CARE_PROVIDER_SITE_OTHER): Payer: Federal, State, Local not specified - PPO | Admitting: Bariatrics

## 2020-04-04 VITALS — BP 149/80 | HR 68 | Temp 98.0°F | Ht 66.0 in | Wt 275.0 lb

## 2020-04-04 DIAGNOSIS — R7303 Prediabetes: Secondary | ICD-10-CM

## 2020-04-04 DIAGNOSIS — I10 Essential (primary) hypertension: Secondary | ICD-10-CM

## 2020-04-04 DIAGNOSIS — Z6841 Body Mass Index (BMI) 40.0 and over, adult: Secondary | ICD-10-CM | POA: Diagnosis not present

## 2020-04-04 NOTE — Progress Notes (Signed)
Chief Complaint:   OBESITY Stacy Wilkerson is here to discuss her progress with her obesity treatment plan along with follow-up of her obesity related diagnoses. Stacy Wilkerson is on the Category 2 Plan and states she is following her eating plan approximately 0% of the time. Stacy Wilkerson states she is walking 6,000 steps 2 times per week.  Today's visit was #: 2 Starting weight: 277 lbs Starting date: 03/21/2020 Today's weight: 275 lbs Today's date: 04/04/2020 Total lbs lost to date: 2 Total lbs lost since last in-office visit: 2  Interim History: Stacy Wilkerson is down 2 lbs, but states she has not followed the plan.  Subjective:   Essential hypertension. [name] is taking Zestril. Blood pressure is slightly elevated today.  BP Readings from Last 3 Encounters:  04/04/20 (!) 149/80  03/21/20 125/82  02/19/19 135/78   Lab Results  Component Value Date   CREATININE 0.66 02/19/2019   CREATININE 1.01 (H) 08/15/2018   CREATININE 0.75 03/16/2017   Prediabetes. [name] has a diagnosis of prediabetes based on her elevated HgA1c and was informed this puts her at greater risk of developing diabetes. She continues to work on diet and exercise to decrease her risk of diabetes. She denies nausea or hypoglycemia. Stacy Wilkerson reports appetite fluctuates. A1c 5.7 on 03/05/2020.  No results found for: HGBA1C Lab Results  Component Value Date   INSULIN 11.2 03/21/2020   Assessment/Plan:   Essential hypertension. Jailey is working on healthy weight loss and exercise to improve blood pressure control. We will watch for signs of hypotension as she continues her lifestyle modifications. She will continue her medication as directed.  Prediabetes. Stacy Wilkerson will continue to work on weight loss, exercise, increasing healthy fats and protein, and decreasing simple carbohydrates to help decrease the risk of diabetes.   Class 3 severe obesity with serious comorbidity and body mass index (BMI) of 40.0 to 44.9 in adult, unspecified  obesity type (HCC).  Stacy Wilkerson is currently in the action stage of change. As such, her goal is to continue with weight loss efforts. She has agreed to the Category 2 Plan.   She will work on meal planning.  We reviewed with the patient labs from 03/21/2020 including Vitamin D, insulin, and thyroid panel.  Exercise goals: She will continue walking 6,000 steps and increased to 3 days a week.  Behavioral modification strategies: increasing lean protein intake, decreasing simple carbohydrates, increasing vegetables, increasing water intake, decreasing eating out, no skipping meals, meal planning and cooking strategies, keeping healthy foods in the home and planning for success.  Stacy Wilkerson has agreed to follow-up with our clinic in 2 weeks. She was informed of the importance of frequent follow-up visits to maximize her success with intensive lifestyle modifications for her multiple health conditions.   Objective:   Blood pressure (!) 149/80, pulse 68, temperature 98 F (36.7 C), height 5\' 6"  (1.676 m), weight 275 lb (124.7 kg), SpO2 98 %. Body mass index is 44.39 kg/m.  General: Cooperative, alert, well developed, in no acute distress. HEENT: Conjunctivae and lids unremarkable. Cardiovascular: Regular rhythm.  Lungs: Normal work of breathing. Neurologic: No focal deficits.   Lab Results  Component Value Date   CREATININE 0.66 02/19/2019   BUN 24 (H) 02/19/2019   NA 141 02/19/2019   K 3.2 (L) 02/19/2019   CL 106 02/19/2019   CO2 28 02/19/2019   Lab Results  Component Value Date   ALT 41 08/15/2018   AST 32 08/15/2018   ALKPHOS 66 08/15/2018  BILITOT 0.8 08/15/2018   No results found for: HGBA1C Lab Results  Component Value Date   INSULIN 11.2 03/21/2020   Lab Results  Component Value Date   TSH 1.850 03/21/2020   No results found for: CHOL, HDL, LDLCALC, LDLDIRECT, TRIG, CHOLHDL Lab Results  Component Value Date   WBC 11.8 (H) 08/15/2018   HGB 14.0 08/15/2018   HCT 41.4  08/15/2018   MCV 88.3 08/15/2018   PLT 230 08/15/2018   No results found for: IRON, TIBC, FERRITIN  Attestation Statements:   Reviewed by clinician on day of visit: allergies, medications, problem list, medical history, surgical history, family history, social history, and previous encounter notes.  Time spent on visit including pre-visit chart review and post-visit charting and care was 30 minutes.   Stacy Wilkerson, am acting as Location manager for [employer], DO   I have reviewed the above documentation for accuracy and completeness, and I agree with the above. Stacy Lesch, DO

## 2020-04-19 ENCOUNTER — Other Ambulatory Visit: Payer: Self-pay

## 2020-04-19 ENCOUNTER — Encounter (INDEPENDENT_AMBULATORY_CARE_PROVIDER_SITE_OTHER): Payer: Self-pay | Admitting: Bariatrics

## 2020-04-19 ENCOUNTER — Ambulatory Visit (INDEPENDENT_AMBULATORY_CARE_PROVIDER_SITE_OTHER): Payer: Federal, State, Local not specified - PPO | Admitting: Bariatrics

## 2020-04-19 VITALS — BP 131/77 | HR 68 | Temp 97.6°F | Ht 66.0 in | Wt 272.0 lb

## 2020-04-19 DIAGNOSIS — M545 Low back pain, unspecified: Secondary | ICD-10-CM

## 2020-04-19 DIAGNOSIS — R7303 Prediabetes: Secondary | ICD-10-CM

## 2020-04-19 DIAGNOSIS — F3289 Other specified depressive episodes: Secondary | ICD-10-CM

## 2020-04-19 DIAGNOSIS — Z9189 Other specified personal risk factors, not elsewhere classified: Secondary | ICD-10-CM

## 2020-04-19 DIAGNOSIS — Z6841 Body Mass Index (BMI) 40.0 and over, adult: Secondary | ICD-10-CM

## 2020-04-19 MED ORDER — BUPROPION HCL ER (SR) 150 MG PO TB12: 150.0000 mg | ORAL_TABLET | Freq: Every day | ORAL | 0 refills | Status: DC

## 2020-04-19 NOTE — Progress Notes (Signed)
Chief Complaint:   OBESITY Stacy Wilkerson is here to discuss her progress with her obesity treatment plan along with follow-up of her obesity related diagnoses. Stacy Wilkerson is on the Category 2 Plan and states she is following her eating plan approximately 50% of the time. Stacy Wilkerson states she is walking 3 miles 3 times per week.  Today's visit was #: 3 Starting weight: 277 lbs Starting date: 03/21/2020 Today's weight: 272 lbs Today's date: 04/19/2020 Total lbs lost to date: 5 Total lbs lost since last in-office visit: 3  Interim History: Stacy Wilkerson is down 3 lbs. She reports some boredom eating. She states she is drinking adequate water.  Subjective:   Prediabetes. Stacy Wilkerson has a diagnosis of prediabetes based on her elevated HgA1c and was informed this puts her at greater risk of developing diabetes. She continues to work on diet and exercise to decrease her risk of diabetes. She denies nausea or hypoglycemia. No polyphagia.  No results found for: HGBA1C Lab Results  Component Value Date   INSULIN 11.2 03/21/2020   Low back pain, unspecified back pain laterality, unspecified chronicity, unspecified whether sciatica present. Stacy Wilkerson has no pain with normal walking.  Other depression, with emotional eating. Stacy Wilkerson is struggling with emotional eating and using food for comfort to the extent that it is negatively impacting her health. She has been working on behavior modification techniques to help reduce her emotional eating and has been somewhat successful. She shows no sign of suicidal or homicidal ideations. She does report some boredom eating.  At risk for activity intolerance. Stacy Wilkerson is at risk of exercise intolerance secondary to back pain and obesity.  Assessment/Plan:   Prediabetes. Stacy Wilkerson will continue to work on weight loss, exercise, increasing healthy fats and protein, and decreasing simple carbohydrates to help decrease the risk of diabetes.   Low back pain, unspecified back pain  laterality, unspecified chronicity, unspecified whether sciatica present. Stacy Wilkerson will continue to walk and will avoid pounding exercises.  Other depression, with emotional eating. Behavior modification techniques were discussed today to help Stacy Wilkerson deal with her emotional/non-hunger eating behaviors.  Orders and follow up as documented in patient record. Stacy Wilkerson was given a prescription for buPROPion (WELLBUTRIN SR) 150 MG 12 hr tablet in the a.m. #30 with 0 refills.  At risk for activity intolerance. Stacy Wilkerson was given approximately 15 minutes of exercise intolerance counseling today. She is 59 y.o. female and has risk factors exercise intolerance including obesity. We discussed intensive lifestyle modifications today with an emphasis on specific weight loss instructions and strategies. Stacy Wilkerson will slowly increase activity as tolerated.  Repetitive spaced learning was employed today to elicit superior memory formation and behavioral change.  Class 3 severe obesity with serious comorbidity and body mass index (BMI) of 40.0 to 44.9 in adult, unspecified obesity type (Stacy Wilkerson).  Stacy Wilkerson is currently in the action stage of change. As such, her goal is to continue with weight loss efforts. She has agreed to the Category 2 Plan.   She will work on meal planning and increasing her water intake.  Recipe sheet was given.  Exercise goals: All adults should avoid inactivity. Some physical activity is better than none, and adults who participate in any amount of physical activity gain some health benefits.  Behavioral modification strategies: increasing lean protein intake, decreasing simple carbohydrates, increasing vegetables, increasing water intake, decreasing eating out, no skipping meals, meal planning and cooking strategies, keeping healthy foods in the home and planning for success.  Stacy Wilkerson has agreed to  follow-up with our clinic in 2 weeks. She was informed of the importance of frequent follow-up visits to  maximize her success with intensive lifestyle modifications for her multiple health conditions.   Objective:   Blood pressure 131/77, pulse 68, temperature 97.6 F (36.4 C), height 5\' 6"  (1.676 m), weight 272 lb (123.4 kg), SpO2 98 %. Body mass index is 43.9 kg/m.  General: Cooperative, alert, well developed, in no acute distress. HEENT: Conjunctivae and lids unremarkable. Cardiovascular: Regular rhythm.  Lungs: Normal work of breathing. Neurologic: No focal deficits.   Lab Results  Component Value Date   CREATININE 0.66 02/19/2019   BUN 24 (H) 02/19/2019   NA 141 02/19/2019   K 3.2 (L) 02/19/2019   CL 106 02/19/2019   CO2 28 02/19/2019   Lab Results  Component Value Date   ALT 41 08/15/2018   AST 32 08/15/2018   ALKPHOS 66 08/15/2018   BILITOT 0.8 08/15/2018   No results found for: HGBA1C Lab Results  Component Value Date   INSULIN 11.2 03/21/2020   Lab Results  Component Value Date   TSH 1.850 03/21/2020   No results found for: CHOL, HDL, LDLCALC, LDLDIRECT, TRIG, CHOLHDL Lab Results  Component Value Date   WBC 11.8 (H) 08/15/2018   HGB 14.0 08/15/2018   HCT 41.4 08/15/2018   MCV 88.3 08/15/2018   PLT 230 08/15/2018   No results found for: IRON, TIBC, FERRITIN  Attestation Statements:   Reviewed by clinician on day of visit: allergies, medications, problem list, medical history, surgical history, family history, social history, and previous encounter notes.  08/17/2018, am acting as Fernanda [employer] for Energy manager, DO   I have reviewed the above documentation for accuracy and completeness, and I agree with the above. [employer], DO

## 2020-04-20 ENCOUNTER — Encounter (INDEPENDENT_AMBULATORY_CARE_PROVIDER_SITE_OTHER): Payer: Self-pay | Admitting: Bariatrics

## 2020-05-10 ENCOUNTER — Ambulatory Visit (INDEPENDENT_AMBULATORY_CARE_PROVIDER_SITE_OTHER): Payer: Federal, State, Local not specified - PPO | Admitting: Bariatrics

## 2020-05-10 ENCOUNTER — Other Ambulatory Visit: Payer: Self-pay

## 2020-05-10 ENCOUNTER — Encounter (INDEPENDENT_AMBULATORY_CARE_PROVIDER_SITE_OTHER): Payer: Self-pay | Admitting: Bariatrics

## 2020-05-10 VITALS — BP 136/79 | HR 60 | Temp 97.5°F | Ht 66.0 in | Wt 270.0 lb

## 2020-05-10 DIAGNOSIS — F3289 Other specified depressive episodes: Secondary | ICD-10-CM

## 2020-05-10 DIAGNOSIS — I10 Essential (primary) hypertension: Secondary | ICD-10-CM

## 2020-05-10 DIAGNOSIS — Z6841 Body Mass Index (BMI) 40.0 and over, adult: Secondary | ICD-10-CM | POA: Diagnosis not present

## 2020-05-10 MED ORDER — BUPROPION HCL ER (SR) 200 MG PO TB12: 200.0000 mg | ORAL_TABLET | Freq: Every day | ORAL | 0 refills | Status: DC

## 2020-05-10 NOTE — Progress Notes (Signed)
Chief Complaint:   OBESITY Stacy Wilkerson is here to discuss her progress with her obesity treatment plan along with follow-up of her obesity related diagnoses. Stacy Wilkerson is on the Category 2 Plan and states she is following her eating plan approximately 50% of the time. Stacy Wilkerson states she is walking 6,000 steps 3 times per week.  Today's visit was #: 4 Starting weight: 277 lbs Starting date: 03/21/2020 Today's weight: 270 lbs Today's date: 05/10/2020 Total lbs lost to date: 7 Total lbs lost since last in-office visit: 2  Interim History: Stacy Wilkerson has lost 2 lbs since her last visit and has done reasonably well overall. She reports doing well with her water intake.  Subjective:   Other depression, with emotional eating. Stacy Wilkerson is struggling with emotional eating and using food for comfort to the extent that it is negatively impacting her health. She has been working on behavior modification techniques to help reduce her emotional eating and has been somewhat successful. She shows no sign of suicidal or homicidal ideations.  Essential hypertension. Stacy Wilkerson is taking HCTZ, Zestril, and Toprol XL. Blood pressure is controlled.  BP Readings from Last 3 Encounters:  05/10/20 136/79  04/19/20 131/77  04/04/20 (!) 149/80   Lab Results  Component Value Date   CREATININE 0.66 02/19/2019   CREATININE 1.01 (H) 08/15/2018   CREATININE 0.75 03/16/2017    Assessment/Plan:   Other depression, with emotional eating. Behavior modification techniques were discussed today to help Stacy Wilkerson deal with her emotional/non-hunger eating behaviors.  Orders and follow up as documented in patient record. Stacy Wilkerson will increase buPROPion [facility] SR) to 200 MG 12 hr tablet 1 PO daily #30 with 0 refills.  Essential hypertension. Stacy Wilkerson is working on healthy weight loss and exercise to improve blood pressure control. We will watch for signs of hypotension as she continues her lifestyle modifications. She will continue  her medications as directed.  Class 3 severe obesity with serious comorbidity and body mass index (BMI) of 40.0 to 44.9 in adult, unspecified obesity type (HCC).  Stacy Wilkerson is currently in the action stage of change. As such, her goal is to continue with weight loss efforts. She has agreed to the Category 2 Plan.   She will work on meal planning, intentional eating, and will be adherent to the plan.  Exercise goals: All adults should avoid inactivity. Some physical activity is better than none, and adults who participate in any amount of physical activity gain some health benefits.  Behavioral modification strategies: increasing lean protein intake, decreasing simple carbohydrates, increasing vegetables, increasing water intake, decreasing eating out, no skipping meals, meal planning and cooking strategies, keeping healthy foods in the home and planning for success.  Stacy Wilkerson has agreed to follow-up with our clinic in 4 weeks. She was informed of the importance of frequent follow-up visits to maximize her success with intensive lifestyle modifications for her multiple health conditions.   Objective:   Blood pressure 136/79, pulse 60, temperature (!) 97.5 F (36.4 C), height 5\' 6"  (1.676 m), weight 270 lb (122.5 kg), SpO2 98 %. Body mass index is 43.58 kg/m.  General: Cooperative, alert, well developed, in no acute distress. HEENT: Conjunctivae and lids unremarkable. Cardiovascular: Regular rhythm.  Lungs: Normal work of breathing. Neurologic: No focal deficits.   Lab Results  Component Value Date   CREATININE 0.66 02/19/2019   BUN 24 (H) 02/19/2019   NA 141 02/19/2019   K 3.2 (Stacy) 02/19/2019   CL 106 02/19/2019   CO2 28  02/19/2019   Lab Results  Component Value Date   ALT 41 08/15/2018   AST 32 08/15/2018   ALKPHOS 66 08/15/2018   BILITOT 0.8 08/15/2018   No results found for: HGBA1C Lab Results  Component Value Date   INSULIN 11.2 03/21/2020   Lab Results  Component Value  Date   TSH 1.850 03/21/2020   No results found for: CHOL, HDL, LDLCALC, LDLDIRECT, TRIG, CHOLHDL Lab Results  Component Value Date   WBC 11.8 (H) 08/15/2018   HGB 14.0 08/15/2018   HCT 41.4 08/15/2018   MCV 88.3 08/15/2018   PLT 230 08/15/2018   No results found for: IRON, TIBC, FERRITIN  Attestation Statements:   Reviewed by clinician on day of visit: allergies, medications, problem list, medical history, surgical history, family history, social history, and previous encounter notes.  Migdalia Dk, am acting as Location manager for [employer], DO   I have reviewed the above documentation for accuracy and completeness, and I agree with the above. Jearld Lesch, DO

## 2020-06-08 IMAGING — CR DG ABDOMEN 2V
3 series · 3 of 3 positions shown · non-contrast
Comparison: None.

CLINICAL DATA: Constipation for 3 days

EXAM:
ABDOMEN - 2 VIEW

[w abdomen upright]
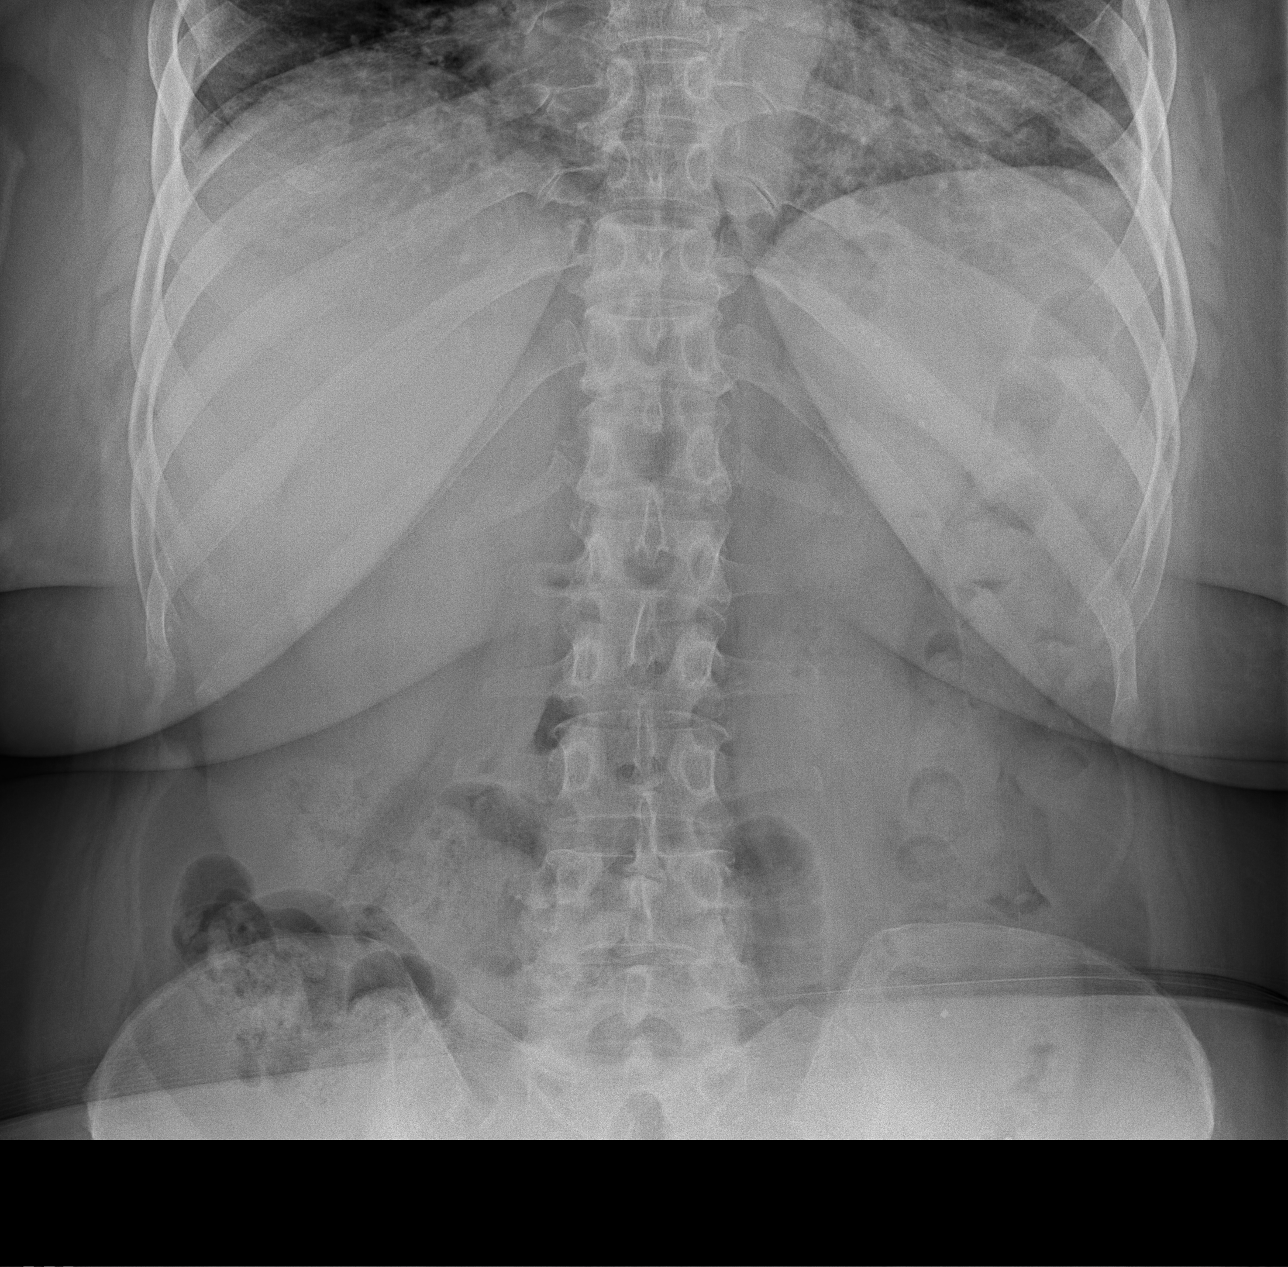

[t abdomen supine (1 of 2)]
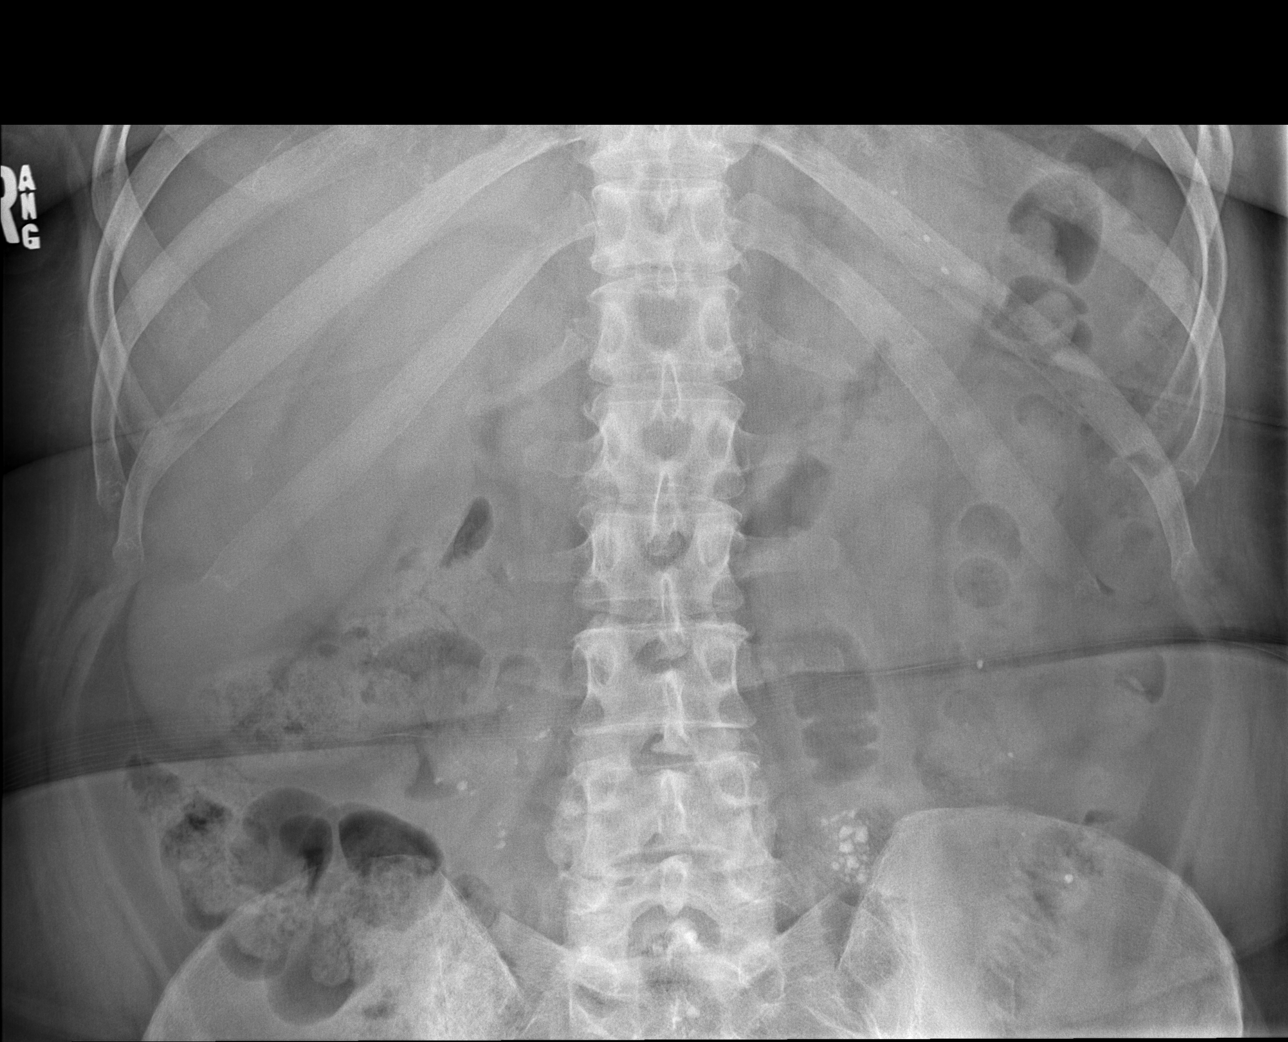

[t abdomen supine (2 of 2)]
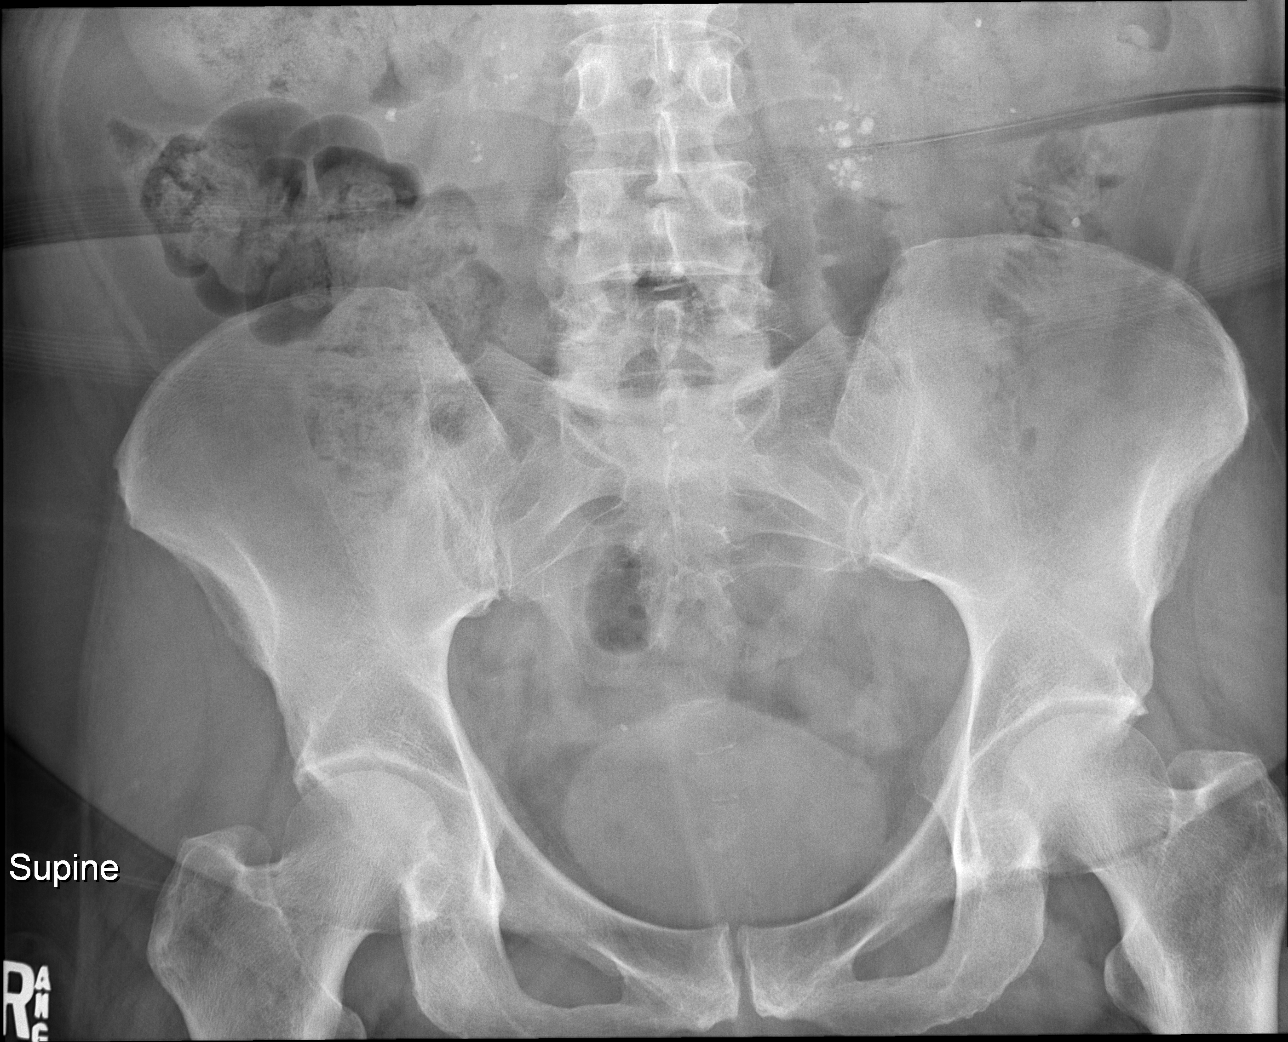

[3 of 3 positions shown; findings below may reference images not displayed]

FINDINGS: Patchy high-density material that is likely enteric. Stool volume is
moderate. No distal stool or rectal impaction. Negative for bowel
obstruction. No concerning mass effect or gas collection.
Reticulation in the lower lungs, less prominent on the second view,
likely interstitial crowding/atelectasis.
IMPRESSION: 1. Moderate stool volume without obstruction or rectal impaction.
2. Reticulation at the lung bases is likely interstitial crowding or
mild atelectasis. Please correlate for acute respiratory symptoms.

## 2020-06-15 ENCOUNTER — Ambulatory Visit (INDEPENDENT_AMBULATORY_CARE_PROVIDER_SITE_OTHER): Payer: Federal, State, Local not specified - PPO | Admitting: Bariatrics

## 2020-08-16 DIAGNOSIS — Z0184 Encounter for antibody response examination: Secondary | ICD-10-CM | POA: Diagnosis not present

## 2020-08-16 DIAGNOSIS — R7309 Other abnormal glucose: Secondary | ICD-10-CM | POA: Diagnosis not present

## 2020-08-16 DIAGNOSIS — I10 Essential (primary) hypertension: Secondary | ICD-10-CM | POA: Diagnosis not present

## 2020-08-16 DIAGNOSIS — Z23 Encounter for immunization: Secondary | ICD-10-CM | POA: Diagnosis not present

## 2020-08-16 DIAGNOSIS — Z0000 Encounter for general adult medical examination without abnormal findings: Secondary | ICD-10-CM | POA: Diagnosis not present

## 2020-08-16 DIAGNOSIS — Z13220 Encounter for screening for lipoid disorders: Secondary | ICD-10-CM | POA: Diagnosis not present

## 2020-08-24 DIAGNOSIS — Z23 Encounter for immunization: Secondary | ICD-10-CM | POA: Diagnosis not present

## 2020-11-17 DIAGNOSIS — I10 Essential (primary) hypertension: Secondary | ICD-10-CM | POA: Diagnosis not present

## 2020-11-17 DIAGNOSIS — R7309 Other abnormal glucose: Secondary | ICD-10-CM | POA: Diagnosis not present

## 2020-11-17 DIAGNOSIS — Z23 Encounter for immunization: Secondary | ICD-10-CM | POA: Diagnosis not present

## 2020-12-20 DIAGNOSIS — E876 Hypokalemia: Secondary | ICD-10-CM | POA: Diagnosis not present

## 2021-01-24 DIAGNOSIS — E876 Hypokalemia: Secondary | ICD-10-CM | POA: Diagnosis not present

## 2021-03-09 DIAGNOSIS — I10 Essential (primary) hypertension: Secondary | ICD-10-CM | POA: Diagnosis not present

## 2021-03-09 DIAGNOSIS — G4733 Obstructive sleep apnea (adult) (pediatric): Secondary | ICD-10-CM | POA: Diagnosis not present

## 2021-03-09 DIAGNOSIS — R7309 Other abnormal glucose: Secondary | ICD-10-CM | POA: Diagnosis not present

## 2021-03-09 DIAGNOSIS — Z6841 Body Mass Index (BMI) 40.0 and over, adult: Secondary | ICD-10-CM | POA: Diagnosis not present

## 2021-05-05 DIAGNOSIS — M222X1 Patellofemoral disorders, right knee: Secondary | ICD-10-CM | POA: Diagnosis not present

## 2021-06-09 DIAGNOSIS — R7309 Other abnormal glucose: Secondary | ICD-10-CM | POA: Diagnosis not present

## 2021-06-09 DIAGNOSIS — I10 Essential (primary) hypertension: Secondary | ICD-10-CM | POA: Diagnosis not present

## 2021-07-10 DIAGNOSIS — M25561 Pain in right knee: Secondary | ICD-10-CM | POA: Diagnosis not present

## 2021-07-14 DIAGNOSIS — M25562 Pain in left knee: Secondary | ICD-10-CM | POA: Diagnosis not present

## 2021-07-19 DIAGNOSIS — M25562 Pain in left knee: Secondary | ICD-10-CM | POA: Diagnosis not present

## 2021-07-21 DIAGNOSIS — M25562 Pain in left knee: Secondary | ICD-10-CM | POA: Diagnosis not present

## 2021-07-31 DIAGNOSIS — M25562 Pain in left knee: Secondary | ICD-10-CM | POA: Diagnosis not present

## 2021-08-02 DIAGNOSIS — M25562 Pain in left knee: Secondary | ICD-10-CM | POA: Diagnosis not present

## 2021-08-04 DIAGNOSIS — M25562 Pain in left knee: Secondary | ICD-10-CM | POA: Diagnosis not present

## 2021-08-04 DIAGNOSIS — M25561 Pain in right knee: Secondary | ICD-10-CM | POA: Diagnosis not present

## 2021-08-08 DIAGNOSIS — M25561 Pain in right knee: Secondary | ICD-10-CM | POA: Diagnosis not present

## 2021-08-11 DIAGNOSIS — M25561 Pain in right knee: Secondary | ICD-10-CM | POA: Diagnosis not present

## 2021-08-25 DIAGNOSIS — M25561 Pain in right knee: Secondary | ICD-10-CM | POA: Diagnosis not present

## 2021-08-30 DIAGNOSIS — M25561 Pain in right knee: Secondary | ICD-10-CM | POA: Diagnosis not present

## 2021-09-06 DIAGNOSIS — Z0000 Encounter for general adult medical examination without abnormal findings: Secondary | ICD-10-CM | POA: Diagnosis not present

## 2021-09-06 DIAGNOSIS — Z13220 Encounter for screening for lipoid disorders: Secondary | ICD-10-CM | POA: Diagnosis not present

## 2021-09-06 DIAGNOSIS — M25561 Pain in right knee: Secondary | ICD-10-CM | POA: Diagnosis not present

## 2021-09-06 DIAGNOSIS — I10 Essential (primary) hypertension: Secondary | ICD-10-CM | POA: Diagnosis not present

## 2021-09-06 DIAGNOSIS — R7309 Other abnormal glucose: Secondary | ICD-10-CM | POA: Diagnosis not present

## 2021-09-13 DIAGNOSIS — M25561 Pain in right knee: Secondary | ICD-10-CM | POA: Diagnosis not present

## 2021-10-31 DIAGNOSIS — G4733 Obstructive sleep apnea (adult) (pediatric): Secondary | ICD-10-CM | POA: Diagnosis not present

## 2021-11-30 DIAGNOSIS — G4733 Obstructive sleep apnea (adult) (pediatric): Secondary | ICD-10-CM | POA: Diagnosis not present

## 2021-12-31 DIAGNOSIS — G4733 Obstructive sleep apnea (adult) (pediatric): Secondary | ICD-10-CM | POA: Diagnosis not present

## 2022-01-10 DIAGNOSIS — I10 Essential (primary) hypertension: Secondary | ICD-10-CM | POA: Diagnosis not present

## 2022-01-10 DIAGNOSIS — R7309 Other abnormal glucose: Secondary | ICD-10-CM | POA: Diagnosis not present

## 2022-04-25 DIAGNOSIS — Z5181 Encounter for therapeutic drug level monitoring: Secondary | ICD-10-CM | POA: Diagnosis not present

## 2022-04-25 DIAGNOSIS — M549 Dorsalgia, unspecified: Secondary | ICD-10-CM | POA: Diagnosis not present

## 2022-04-25 DIAGNOSIS — I10 Essential (primary) hypertension: Secondary | ICD-10-CM | POA: Diagnosis not present

## 2022-04-25 DIAGNOSIS — R7309 Other abnormal glucose: Secondary | ICD-10-CM | POA: Diagnosis not present

## 2022-05-04 DIAGNOSIS — R945 Abnormal results of liver function studies: Secondary | ICD-10-CM | POA: Diagnosis not present

## 2022-05-04 DIAGNOSIS — E875 Hyperkalemia: Secondary | ICD-10-CM | POA: Diagnosis not present

## 2022-06-12 DIAGNOSIS — H903 Sensorineural hearing loss, bilateral: Secondary | ICD-10-CM | POA: Diagnosis not present

## 2022-08-08 ENCOUNTER — Encounter (INDEPENDENT_AMBULATORY_CARE_PROVIDER_SITE_OTHER): Payer: Self-pay

## 2022-09-17 DIAGNOSIS — Z0000 Encounter for general adult medical examination without abnormal findings: Secondary | ICD-10-CM | POA: Diagnosis not present

## 2022-09-17 DIAGNOSIS — R7309 Other abnormal glucose: Secondary | ICD-10-CM | POA: Diagnosis not present

## 2022-09-17 DIAGNOSIS — I10 Essential (primary) hypertension: Secondary | ICD-10-CM | POA: Diagnosis not present

## 2022-09-17 DIAGNOSIS — E876 Hypokalemia: Secondary | ICD-10-CM | POA: Diagnosis not present

## 2022-09-17 DIAGNOSIS — G4733 Obstructive sleep apnea (adult) (pediatric): Secondary | ICD-10-CM | POA: Diagnosis not present

## 2022-09-17 DIAGNOSIS — Z13220 Encounter for screening for lipoid disorders: Secondary | ICD-10-CM | POA: Diagnosis not present

## 2022-12-05 DIAGNOSIS — J040 Acute laryngitis: Secondary | ICD-10-CM | POA: Diagnosis not present

## 2022-12-05 DIAGNOSIS — J069 Acute upper respiratory infection, unspecified: Secondary | ICD-10-CM | POA: Diagnosis not present

## 2023-02-12 DIAGNOSIS — R7309 Other abnormal glucose: Secondary | ICD-10-CM | POA: Diagnosis not present

## 2023-02-12 DIAGNOSIS — I10 Essential (primary) hypertension: Secondary | ICD-10-CM | POA: Diagnosis not present

## 2023-02-12 DIAGNOSIS — B351 Tinea unguium: Secondary | ICD-10-CM | POA: Diagnosis not present

## 2023-02-12 DIAGNOSIS — Z5181 Encounter for therapeutic drug level monitoring: Secondary | ICD-10-CM | POA: Diagnosis not present

## 2023-03-19 DIAGNOSIS — B351 Tinea unguium: Secondary | ICD-10-CM | POA: Diagnosis not present

## 2023-04-17 DIAGNOSIS — K080 Exfoliation of teeth due to systemic causes: Secondary | ICD-10-CM | POA: Diagnosis not present

## 2023-06-18 DIAGNOSIS — I10 Essential (primary) hypertension: Secondary | ICD-10-CM | POA: Diagnosis not present

## 2023-06-18 DIAGNOSIS — R7309 Other abnormal glucose: Secondary | ICD-10-CM | POA: Diagnosis not present

## 2023-06-18 DIAGNOSIS — B351 Tinea unguium: Secondary | ICD-10-CM | POA: Diagnosis not present

## 2023-09-25 DIAGNOSIS — Z13220 Encounter for screening for lipoid disorders: Secondary | ICD-10-CM | POA: Diagnosis not present

## 2023-09-25 DIAGNOSIS — I10 Essential (primary) hypertension: Secondary | ICD-10-CM | POA: Diagnosis not present

## 2023-09-25 DIAGNOSIS — R7309 Other abnormal glucose: Secondary | ICD-10-CM | POA: Diagnosis not present

## 2023-09-25 DIAGNOSIS — E876 Hypokalemia: Secondary | ICD-10-CM | POA: Diagnosis not present

## 2023-09-25 DIAGNOSIS — Z0000 Encounter for general adult medical examination without abnormal findings: Secondary | ICD-10-CM | POA: Diagnosis not present

## 2024-01-28 DIAGNOSIS — E876 Hypokalemia: Secondary | ICD-10-CM | POA: Diagnosis not present

## 2024-01-28 DIAGNOSIS — R7309 Other abnormal glucose: Secondary | ICD-10-CM | POA: Diagnosis not present

## 2024-01-28 DIAGNOSIS — I10 Essential (primary) hypertension: Secondary | ICD-10-CM | POA: Diagnosis not present

## 2024-01-29 DIAGNOSIS — G4733 Obstructive sleep apnea (adult) (pediatric): Secondary | ICD-10-CM | POA: Diagnosis not present

## 2024-01-29 DIAGNOSIS — I10 Essential (primary) hypertension: Secondary | ICD-10-CM | POA: Diagnosis not present

## 2024-01-29 DIAGNOSIS — G2581 Restless legs syndrome: Secondary | ICD-10-CM | POA: Diagnosis not present

## 2024-02-12 DIAGNOSIS — G4733 Obstructive sleep apnea (adult) (pediatric): Secondary | ICD-10-CM | POA: Diagnosis not present

## 2024-04-11 DIAGNOSIS — G4733 Obstructive sleep apnea (adult) (pediatric): Secondary | ICD-10-CM | POA: Diagnosis not present

## 2024-05-11 DIAGNOSIS — G4733 Obstructive sleep apnea (adult) (pediatric): Secondary | ICD-10-CM | POA: Diagnosis not present

## 2024-06-08 DIAGNOSIS — R7309 Other abnormal glucose: Secondary | ICD-10-CM | POA: Diagnosis not present

## 2024-06-08 DIAGNOSIS — I10 Essential (primary) hypertension: Secondary | ICD-10-CM | POA: Diagnosis not present

## 2024-06-08 DIAGNOSIS — E876 Hypokalemia: Secondary | ICD-10-CM | POA: Diagnosis not present

## 2024-06-11 DIAGNOSIS — G4733 Obstructive sleep apnea (adult) (pediatric): Secondary | ICD-10-CM | POA: Diagnosis not present

## 2024-06-19 ENCOUNTER — Encounter: Payer: Self-pay | Admitting: Gastroenterology

## 2024-06-26 ENCOUNTER — Ambulatory Visit (AMBULATORY_SURGERY_CENTER)

## 2024-06-26 VITALS — Ht 66.0 in | Wt 230.0 lb

## 2024-06-26 DIAGNOSIS — Z1211 Encounter for screening for malignant neoplasm of colon: Secondary | ICD-10-CM

## 2024-06-26 MED ORDER — NA SULFATE-K SULFATE-MG SULF 17.5-3.13-1.6 GM/177ML PO SOLN
1.0000 | Freq: Once | ORAL | 0 refills | Status: AC
Start: 2024-06-26 — End: 2024-06-26

## 2024-06-26 NOTE — Progress Notes (Signed)
 No egg or soy allergy known to patient  No issues known to pt with past sedation with any surgeries or procedures Patient denies ever being told they had issues or difficulty with intubation  No FH of Malignant Hyperthermia Pt is not on diet pills Pt is not on  home 02, OSA using CPAP  Pt is not on blood thinners  Pt denies issues with constipation  No A fib or A flutter Have any cardiac testing pending-- no  LOA: independent  Prep: suprep  Patient's chart reviewed by Norleen Schillings CNRA prior to previsit and patient appropriate for the [facility].  Previsit completed and red dot placed by patient's name on their procedure day (on provider's schedule).     PV completed with patient. Prep instructions sent via mychart and home address.

## 2024-07-05 ENCOUNTER — Other Ambulatory Visit: Payer: Self-pay | Admitting: Medical Genetics

## 2024-07-07 ENCOUNTER — Other Ambulatory Visit

## 2024-07-07 DIAGNOSIS — Z006 Encounter for examination for normal comparison and control in clinical research program: Secondary | ICD-10-CM

## 2024-07-10 ENCOUNTER — Encounter: Payer: Self-pay | Admitting: Gastroenterology

## 2024-07-10 ENCOUNTER — Ambulatory Visit (AMBULATORY_SURGERY_CENTER): Admitting: Gastroenterology

## 2024-07-10 VITALS — BP 146/103 | HR 71 | Temp 98.0°F | Resp 14 | Ht 66.0 in | Wt 230.0 lb

## 2024-07-10 DIAGNOSIS — Z1211 Encounter for screening for malignant neoplasm of colon: Secondary | ICD-10-CM

## 2024-07-10 DIAGNOSIS — K640 First degree hemorrhoids: Secondary | ICD-10-CM

## 2024-07-10 MED ORDER — SODIUM CHLORIDE 0.9 % IV SOLN: 500.0000 mL | Freq: Once | INTRAVENOUS | Status: DC

## 2024-07-10 NOTE — Patient Instructions (Signed)
 Discharge instructions given. Handout on Hemorrhoids. Resume previous medications. YOU HAD AN ENDOSCOPIC PROCEDURE TODAY AT THE [facility]:   Refer to the procedure report that was given to you for any specific questions about what was found during the examination.  If the procedure report does not answer your questions, please call your gastroenterologist to clarify.  If you requested that your care partner not be given the details of your procedure findings, then the procedure report has been included in a sealed envelope for you to review at your convenience later.  YOU SHOULD EXPECT: Some feelings of bloating in the abdomen. Passage of more gas than usual.  Walking can help get rid of the air that was put into your GI tract during the procedure and reduce the bloating. If you had a lower endoscopy (such as a colonoscopy or flexible sigmoidoscopy) you may notice spotting of blood in your stool or on the toilet paper. If you underwent a bowel prep for your procedure, you may not have a normal bowel movement for a few days.  Please Note:  You might notice some irritation and congestion in your nose or some drainage.  This is from the oxygen used during your procedure.  There is no need for concern and it should clear up in a day or so.  SYMPTOMS TO REPORT IMMEDIATELY:  Following lower endoscopy (colonoscopy or flexible sigmoidoscopy):  Excessive amounts of blood in the stool  Significant tenderness or worsening of abdominal pains  Swelling of the abdomen that is new, acute  Fever of 100F or higher    For urgent or emergent issues, a gastroenterologist can be reached at any hour by calling [phone]. Do not use MyChart messaging for urgent concerns.    DIET:  We do recommend a small meal at first, but then you may proceed to your regular diet.  Drink plenty of fluids but you should avoid alcoholic beverages for 24 hours.  ACTIVITY:  You should plan to take it easy for the  rest of today and you should NOT DRIVE or use heavy machinery until tomorrow (because of the sedation medicines used during the test).    FOLLOW UP: Our staff will call the number listed on your records the next business day following your procedure.  We will call around 7:15- 8:00 am to check on you and address any questions or concerns that you may have regarding the information given to you following your procedure. If we do not reach you, we will leave a message.     If any biopsies were taken you will be contacted by phone or by letter within the next 1-3 weeks.  Please call us at [phone] if you have not heard about the biopsies in 3 weeks.    SIGNATURES/CONFIDENTIALITY: You and/or your care partner have signed paperwork which will be entered into your electronic medical record.  These signatures attest to the fact that that the information above on your After Visit Summary has been reviewed and is understood.  Full responsibility of the confidentiality of this discharge information lies with you and/or your care-partner.

## 2024-07-10 NOTE — Op Note (Signed)
 [facility] Patient Name: Stacy Wilkerson Procedure Date: 07/10/2024 9:59 AM MRN: [id number] Endoscopist: Glendia E. Stacia , MD, [id number] Age: 63 Referring MD:  Date of Birth: 1961/12/28 Gender: Female Account #: [id number] Procedure:                Colonoscopy Indications:              Screening for colorectal malignant neoplasm (last                            colonoscopy was more than 10 years ago) Medicines:                Monitored Anesthesia Care Procedure:                Pre-Anesthesia Assessment:                           - Prior to the procedure, a History and Physical                            was performed, and patient medications and                            allergies were reviewed. The patient's tolerance of                            previous anesthesia was also reviewed. The risks                            and benefits of the procedure and the sedation                            options and risks were discussed with the patient.                            All questions were answered, and informed consent                            was obtained. Prior Anticoagulants: The patient has                            taken no anticoagulant or antiplatelet agents. ASA                            Grade Assessment: II - A patient with mild systemic                            disease. After reviewing the risks and benefits,                            the patient was deemed in satisfactory condition to                            undergo the procedure.  After obtaining informed consent, the colonoscope                            was passed under direct vision. Throughout the                            procedure, the patient's blood pressure, pulse, and                            oxygen saturations were monitored continuously. The                            CF [id number] #[id number] was introduced through the anus                            and  advanced to the the cecum, identified by                            appendiceal orifice and ileocecal valve. The                            colonoscopy was performed without difficulty. The                            patient tolerated the procedure well. The quality                            of the bowel preparation was excellent. The                            ileocecal valve, appendiceal orifice, and rectum                            were photographed. The bowel preparation used was                            SUPREP via split dose instruction. Scope In: 10:04:11 AM Scope Out: 10:18:04 AM Scope Withdrawal Time: 0 hours 7 minutes 6 seconds  Total Procedure Duration: 0 hours 13 minutes 53 seconds  Findings:                 The perianal and digital rectal examinations were                            normal. Pertinent negatives include normal                            sphincter tone and no palpable rectal lesions.                           The colon (entire examined portion) appeared normal.                           Non-bleeding internal hemorrhoids were found during  retroflexion. The hemorrhoids were Grade I                            (internal hemorrhoids that do not prolapse). Complications:            No immediate complications. Estimated Blood Loss:     Estimated blood loss: none. Impression:               - The entire examined colon is normal.                           - Non-bleeding internal hemorrhoids.                           - No specimens collected. Recommendation:           - Patient has a contact number available for                            emergencies. The signs and symptoms of potential                            delayed complications were discussed with the                            patient. Return to normal activities tomorrow.                            Written discharge instructions were provided to the                             patient.                           - Resume previous diet.                           - Continue present medications.                           - Repeat colonoscopy in 10 years for screening                            purposes. Brennen Camper E. Stacia, MD 07/10/2024 10:25:01 AM This report has been signed electronically.

## 2024-07-10 NOTE — Progress Notes (Signed)
 To pacu, VSS. Report to [email or web address]

## 2024-07-10 NOTE — Progress Notes (Signed)
 [facility] History and Physical   Primary Care Physician:  Kip Righter, MD   Reason for Procedure:   Colon cancer screening  Plan:    Screening     HPI: Stacy Wilkerson is a 63 y.o. female undergoing average risk screening colonoscopy.  He has no family history of colon cancer and no chronic GI symptoms.  She had a normal colonoscopy in 2011.   Past Medical History:  Diagnosis Date   Back pain    Constipated    Constipation    DJD (degenerative joint disease)    Edema of both lower extremities due to peripheral venous insufficiency    GERD (gastroesophageal reflux disease)    Hypertension    Joint pain    Kidney infection 2019   Pre-diabetes    Sleep apnea    cpap does not know settings   Vitamin D deficiency     Past Surgical History:  Procedure Laterality Date   EYE SURGERY     right eye retinal tear surgery, cataracts both eyes   KNEE ARTHROSCOPY Left 02/19/2019   Procedure: Left knee arthroscopy posterior root repair;  Surgeon: Sharl Selinda Dover, MD;  Location: [facility];  Service: Orthopedics;  Laterality: Left;  90 mins   pre diabetes     ROTATOR CUFF REPAIR Right    x 2    Prior to Admission medications   Medication Sig Start Date End Date Taking? Authorizing Provider  Cholecalciferol (VITAMIN D) 2000 units CAPS Take 5,000 Units by mouth daily.    Yes [provider]  felodipine (PLENDIL) 5 MG 24 hr tablet Take 5 mg by mouth daily.   Yes [provider]  hydrochlorothiazide (HYDRODIURIL) 25 MG tablet Take 25 mg by mouth every morning.   Yes [provider]  lisinopril (PRINIVIL,ZESTRIL) 40 MG tablet Take 40 mg by mouth daily. 03/05/17  Yes [provider]  metoprolol succinate (TOPROL-XL) 100 MG 24 hr tablet Take 100 mg by mouth daily. Take with or immediately following a meal.   Yes [provider]  Multiple Vitamin (MULTIVITAMIN WITH MINERALS) TABS tablet Take 1 tablet by mouth  daily.   Yes [provider]  omeprazole (PRILOSEC) 20 MG capsule Take 20 mg by mouth every morning.   Yes [provider]  Potassium Chloride  ER 20 MEQ TBCR Take 2 tablets by mouth 2 (two) times daily.   Yes [provider]  SUPER B COMPLEX/C PO Take 1 tablet by mouth daily.   Yes [provider]  Turmeric 500 MG CAPS Take 1,000 mg by mouth.   Yes [provider]  FIBER PO Take 2 tablets by mouth daily. Patient taking differently: Take 2 tablets by mouth daily as needed.    [provider]  ibuprofen  (ADVIL ) 800 MG tablet Take 800 mg by mouth 3 (three) times daily. 05/29/22   [provider]    Current Outpatient Medications  Medication Sig Dispense Refill   Cholecalciferol (VITAMIN D) 2000 units CAPS Take 5,000 Units by mouth daily.      felodipine (PLENDIL) 5 MG 24 hr tablet Take 5 mg by mouth daily.     hydrochlorothiazide (HYDRODIURIL) 25 MG tablet Take 25 mg by mouth every morning.     lisinopril (PRINIVIL,ZESTRIL) 40 MG tablet Take 40 mg by mouth daily.  5   metoprolol succinate (TOPROL-XL) 100 MG 24 hr tablet Take 100 mg by mouth daily. Take with or immediately following a meal.  Multiple Vitamin (MULTIVITAMIN WITH MINERALS) TABS tablet Take 1 tablet by mouth daily.     omeprazole (PRILOSEC) 20 MG capsule Take 20 mg by mouth every morning.     Potassium Chloride  ER 20 MEQ TBCR Take 2 tablets by mouth 2 (two) times daily.     SUPER B COMPLEX/C PO Take 1 tablet by mouth daily.     Turmeric 500 MG CAPS Take 1,000 mg by mouth.     FIBER PO Take 2 tablets by mouth daily. (Patient taking differently: Take 2 tablets by mouth daily as needed.)     ibuprofen  (ADVIL ) 800 MG tablet Take 800 mg by mouth 3 (three) times daily.     Current Facility-Administered Medications  Medication Dose Route Frequency Provider Last Rate Last Admin   0.9 %  sodium chloride  infusion  500 mL Intravenous Once Stacia Glendia BRAVO, MD         Allergies as of 07/10/2024 - Review Complete 06/26/2024  Allergen Reaction Noted   Sulfa antibiotics Other (See Comments) 02/12/2017    Family History  Problem Relation Age of Onset   High blood pressure Mother    Obesity Mother    Cancer Father    Colon cancer Neg Hx    Rectal cancer Neg Hx    Stomach cancer Neg Hx    Esophageal cancer Neg Hx     Social History   Socioeconomic History   Marital status: Significant Other    Spouse name: Abby   Number of children: Not on file   Years of education: Not on file   Highest education level: Not on file  Occupational History   Occupation: Dietitian  Tobacco Use   Smoking status: Never   Smokeless tobacco: Never  Vaping Use   Vaping status: Never Used  Substance and Sexual Activity   Alcohol use: Yes    Comment: social   Drug use: No   Sexual activity: Not on file  Other Topics Concern   Not on file  Social History Narrative   Not on file   Social Drivers of Health   Financial Resource Strain: Not on file  Food Insecurity: Not on file  Transportation Needs: Not on file  Physical Activity: Not on file  Stress: Not on file  Social Connections: Not on file  Intimate Partner Violence: Not on file    Review of Systems:  All other review of systems negative except as mentioned in the HPI.  Physical Exam: Vital signs BP (!) 142/76   Temp 98 F (36.7 C)   Ht 5' 6 (1.676 m)   Wt 230 lb (104.3 kg)   SpO2 99%   BMI 37.12 kg/m   General:   Alert,  Well-developed, well-nourished, pleasant and cooperative in NAD Airway:  Mallampati 3 Lungs:  Clear throughout to auscultation.   Heart:  Regular rate and rhythm; no murmurs, clicks, rubs,  or gallops. Abdomen:  Soft, nontender and nondistended. Normal bowel sounds.   Neuro/Psych:  Normal mood and affect. A and O x 3   Aquila Delaughter E. Stacia, MD [facility]

## 2024-07-10 NOTE — Progress Notes (Signed)
Vs by DT  Pt's states no medical or surgical changes since previsit or office visit.  

## 2024-07-11 DIAGNOSIS — G4733 Obstructive sleep apnea (adult) (pediatric): Secondary | ICD-10-CM | POA: Diagnosis not present

## 2024-07-13 ENCOUNTER — Telehealth: Payer: Self-pay

## 2024-07-13 NOTE — Telephone Encounter (Signed)
 Left message

## 2024-07-15 DIAGNOSIS — H26493 Other secondary cataract, bilateral: Secondary | ICD-10-CM | POA: Diagnosis not present

## 2024-07-18 LAB — GENECONNECT MOLECULAR SCREEN: Genetic Analysis Overall Interpretation: NEGATIVE

## 2024-08-11 DIAGNOSIS — G4733 Obstructive sleep apnea (adult) (pediatric): Secondary | ICD-10-CM | POA: Diagnosis not present

## 2024-09-11 DIAGNOSIS — G4733 Obstructive sleep apnea (adult) (pediatric): Secondary | ICD-10-CM | POA: Diagnosis not present

## 2024-10-06 DIAGNOSIS — R7309 Other abnormal glucose: Secondary | ICD-10-CM | POA: Diagnosis not present

## 2024-10-06 DIAGNOSIS — Z0000 Encounter for general adult medical examination without abnormal findings: Secondary | ICD-10-CM | POA: Diagnosis not present

## 2024-10-06 DIAGNOSIS — R609 Edema, unspecified: Secondary | ICD-10-CM | POA: Diagnosis not present

## 2024-10-06 DIAGNOSIS — I10 Essential (primary) hypertension: Secondary | ICD-10-CM | POA: Diagnosis not present

## 2024-10-06 DIAGNOSIS — E876 Hypokalemia: Secondary | ICD-10-CM | POA: Diagnosis not present

## 2024-10-06 DIAGNOSIS — Z23 Encounter for immunization: Secondary | ICD-10-CM | POA: Diagnosis not present
